# Patient Record
Sex: Female | Born: 1995
Health system: Southern US, Community
[De-identification: ages and names within clinical notes are randomized; demographics above are authoritative.]

## PROBLEM LIST (undated history)

## (undated) HISTORY — PX: NO PAST SURGERIES: SHX2092

---

## 2015-03-08 ENCOUNTER — Encounter: Payer: Self-pay | Admitting: Family Medicine

## 2015-03-08 ENCOUNTER — Ambulatory Visit (INDEPENDENT_AMBULATORY_CARE_PROVIDER_SITE_OTHER): Payer: BLUE CROSS/BLUE SHIELD | Admitting: Family Medicine

## 2015-03-08 VITALS — BP 118/76 | HR 118 | Temp 98.0°F | Resp 16 | Ht 62.5 in | Wt 128.0 lb

## 2015-03-08 DIAGNOSIS — F419 Anxiety disorder, unspecified: Secondary | ICD-10-CM | POA: Insufficient documentation

## 2015-03-08 MED ORDER — ESCITALOPRAM OXALATE 10 MG PO TABS: 10.0000 mg | ORAL_TABLET | Freq: Every day | ORAL | Status: DC

## 2015-03-08 NOTE — Progress Notes (Signed)
Subjective:     Patient ID: Alexis Myers, female   DOB: Sep 14, 1995, 20 y.o.   MRN: [id number]  Chief Complaint  Patient presents with  . Anxiety    Worsening since March 2016    Anxiety Symptoms include nervous/anxious behavior.    Started March 2016, was intermittent now experiences anxiety every day. ( In March 2016, she had hives over her upper body, she thinks secondary to reaction to a shirt she was wearing and anxiety. Hives worsened her anxiety, which also worsened with prednisone). After summer, she felt her friends didn't like her. She "physically could not bring herself" to write papers, stressing out about grades. Grades are ok.  She believes some of it is social anxiety. She feels like she will pass out, though denies syncopal episodes.Does see counselor at school every 1-2 weeks. They work on coping strategies, recognizing negative thoughts, and positive thought exercises. Her counselor is in grad school for training and  not able to start medications. Pt is open to starting medications to help her anxiety here, and has spoken to a friend who takes psych medications. She maxed out her counseling sessions at school, but does not feel she needs to see a counselor on a regular basis at this time.   When anxious, she feels shaky, cold, tongue swelling, heat/anger, intermittent touble swallowing, abd pain. Does feel distracted with negative thoughts, decreased appetite, irritable, tired. Denies palpitations, SOB, chest pain, restlessness. Has some trouble falling asleep sometimes, but mostly good 7-8 hrs a night. Has had 2 panic attacks, last one before a presentation. Lasted 5-10 minutes with crying, heaviness on her chest.   She is a Holiday representative in [employer], Primary school teacher major. She believes she is anxious about "everything". Stressors include job Financial controller, Clinical cytogeneticist at [employer] (from which she had to step down because she felt "incompetent" and wasn't fair to people in the  club), friends, and schoolwork. She has 2 classes this upcoming semester. She has A's and B's but is concerned her lack of motivation will interfere with schoolwork in the new year. She has several close friends including her roommate, who have been supportive. Sometimes she takes it as rejection when friends don't want to hang out with her or text her back quickly, though she recognizes such feelings are probably because of her anxiety. Denies losing friends because of her anxiety. Family is supportive. Here with her mom today.    Rare alcohol use (2 drinks once a month). No tobacco or drug use. Drinks 1 venti coffee and a soda every day at school. She believes caffeine worsens her anxiety.   She is intermittently tearful during the visit. She is accompanied by her mother, and pt insists that her mom stay in the room. Mom takes 10 mg Paxil a day to prevent panic attacks, which started after her pregnancy.       Review of Systems  Constitutional: Negative.   Eyes: Negative.   Respiratory: Negative.   Neurological: Negative.   Psychiatric/Behavioral: Positive for behavioral problems. The patient is nervous/anxious.     Patient Active Problem List   Diagnosis Date Noted  . Anxiety 03/08/2015   Previous Medications   No medications on file   No Known Allergies  Past Surgical History  Procedure Laterality Date  . No past surgeries     Family History  Problem Relation Age of Onset  . Healthy Mother   . Healthy Father   . Healthy Sister   . Diabetes  Other   . Hypertension Other    Social History   Social History  . Marital Status: Single    Spouse Name: N/A  . Number of Children: N/A  . Years of Education: N/A   Occupational History  . Full Time Student at [employer]     Senior   Social History Main Topics  . Smoking status: Never Smoker   . Smokeless tobacco: Never Used  . Alcohol Use: No  . Drug Use: No  . Sexual Activity: Not on file   Other Topics Concern   . Not on file   Social History Narrative       Objective:   Physical Exam  Constitutional: She is oriented to person, place, and time. She appears well-developed and well-nourished. No distress.  HENT:  Head: Normocephalic and atraumatic.  Eyes: EOM are normal. Pupils are equal, round, and reactive to light.  Musculoskeletal: Normal range of motion. She exhibits no edema.  Neurological: She is alert and oriented to person, place, and time. No cranial nerve deficit.  Skin: She is not diaphoretic.  Psychiatric: She has a normal mood and affect. Her behavior is normal. Judgment and thought content normal.  Intermittently tearful  Vitals reviewed.   BP 118/76 mmHg  Pulse 118  Temp(Src) 98 F (36.7 C) (Oral)  Resp 16  Ht 5' 2.5" (1.588 m)  Wt 128 lb (58.06 kg)  BMI 23.02 kg/m2  LMP 02/15/2015 (Exact Date)     Assessment:    Acute Anxiety, worsening.     Plan:     1. Acute anxiety New problem. Not controlled with cognitive therapy. Mom responded to SSRI. Will start Lexapro. Contracted for safety. Will communicate with me thru My Chart. Patient instructed to call back if condition worsens or does not improve.   Mom in room for ov.    Lorie PhenixNancy Aireona Torelli, MD

## 2015-03-16 ENCOUNTER — Encounter: Payer: Self-pay | Admitting: Family Medicine

## 2015-04-04 ENCOUNTER — Encounter: Payer: Self-pay | Admitting: Family Medicine

## 2015-04-06 ENCOUNTER — Other Ambulatory Visit: Payer: Self-pay | Admitting: Family Medicine

## 2015-04-06 MED ORDER — ESCITALOPRAM OXALATE 10 MG PO TABS: 10.0000 mg | ORAL_TABLET | Freq: Every day | ORAL | Status: DC

## 2015-05-20 ENCOUNTER — Encounter: Payer: Self-pay | Admitting: Family Medicine

## 2015-06-30 ENCOUNTER — Other Ambulatory Visit: Payer: Self-pay | Admitting: Family Medicine

## 2015-06-30 DIAGNOSIS — F419 Anxiety disorder, unspecified: Secondary | ICD-10-CM

## 2015-07-04 ENCOUNTER — Encounter: Payer: Self-pay | Admitting: Family Medicine

## 2015-07-04 DIAGNOSIS — F419 Anxiety disorder, unspecified: Secondary | ICD-10-CM

## 2015-07-04 MED ORDER — ESCITALOPRAM OXALATE 10 MG PO TABS: ORAL_TABLET | ORAL | Status: DC

## 2015-08-30 ENCOUNTER — Other Ambulatory Visit: Payer: Self-pay

## 2015-08-30 DIAGNOSIS — F419 Anxiety disorder, unspecified: Secondary | ICD-10-CM

## 2015-08-30 MED ORDER — ESCITALOPRAM OXALATE 10 MG PO TABS: ORAL_TABLET | ORAL | Status: DC

## 2016-02-17 ENCOUNTER — Telehealth: Payer: Self-pay

## 2016-02-17 DIAGNOSIS — F419 Anxiety disorder, unspecified: Secondary | ICD-10-CM

## 2016-02-17 MED ORDER — ESCITALOPRAM OXALATE 10 MG PO TABS: 15.0000 mg | ORAL_TABLET | Freq: Every day | ORAL | 0 refills | Status: DC

## 2016-02-17 NOTE — Telephone Encounter (Signed)
[facility]

## 2016-02-17 NOTE — Telephone Encounter (Signed)
Pt advised-aa 

## 2016-02-17 NOTE — Telephone Encounter (Signed)
Patient is calling requesting a refill on the Lexapro 10mg . She reports that over the last 2 weeks, she has had to take 1.5 tablets (15mg  total), and it seemed to help with her mood. She reports that if she continues to take it this way, she will run out early. Patient reports that she was formerly a patient of Dr. Santiago BurMaloney's. It looks like she seen you once in 07/2014. Ok to send in refill into the pharmacy? Pharmacy has been updated. Thanks!

## 2016-02-17 NOTE — Telephone Encounter (Signed)
Please review-aa 

## 2016-05-09 ENCOUNTER — Telehealth: Payer: Self-pay | Admitting: Physician Assistant

## 2016-05-09 NOTE — Telephone Encounter (Signed)
Mother sent message through [place] about patient gaining weight on lexapro. I would recommend patient being seen to discuss changing since patient has not been seen for this since 03/2015 with Dr. Elease HashimotoMaloney. Please call to see if they are willing to schedule follow up.

## 2016-05-09 NOTE — Telephone Encounter (Signed)
Patient advised. Follow up appointment scheduled.  

## 2016-05-14 ENCOUNTER — Ambulatory Visit: Payer: Self-pay | Admitting: Physician Assistant

## 2016-05-25 ENCOUNTER — Encounter: Payer: Self-pay | Admitting: Physician Assistant

## 2016-05-25 ENCOUNTER — Ambulatory Visit (INDEPENDENT_AMBULATORY_CARE_PROVIDER_SITE_OTHER): Payer: BLUE CROSS/BLUE SHIELD | Admitting: Physician Assistant

## 2016-05-25 VITALS — BP 130/70 | HR 65 | Temp 98.7°F | Resp 16 | Wt 155.0 lb

## 2016-05-25 DIAGNOSIS — F411 Generalized anxiety disorder: Secondary | ICD-10-CM

## 2016-05-25 DIAGNOSIS — R635 Abnormal weight gain: Secondary | ICD-10-CM

## 2016-05-25 DIAGNOSIS — T50905A Adverse effect of unspecified drugs, medicaments and biological substances, initial encounter: Secondary | ICD-10-CM

## 2016-05-25 MED ORDER — PAROXETINE HCL 10 MG PO TABS: 10.0000 mg | ORAL_TABLET | Freq: Every day | ORAL | 0 refills | Status: DC

## 2016-05-25 NOTE — Progress Notes (Signed)
       Patient: Alexis Myers Female    DOB: 1995/05/11   21 y.o.   MRN: [id number] Visit Date: 05/25/2016  Today's Provider: Margaretann LovelessJennifer M Earley Grobe, PA-C   Chief Complaint  Patient presents with  . Weight Check   Subjective:    HPI Patient is here today to discuss weight gain.  Pt Mother sent message through mychart about patient gaining weight on lexapro. Patient was started on Lexapro 10 mg on 03/08/15 by previous doctor for Anxiety. Last refill on medication was on 02/17/16 and was increased to 15mg  because patient reported that she was taking 1.5 tablet (15mg  total) because it seemed to help her mood. Side effect from the Lexapro wt gain.  She feels that her mood is stable on the Lexapro, but she doesn't like the weight gain. She reports that she is cooking more meals at home instead of eating out. She used to walk more than know but she is getting back on track.     No Known Allergies   Current Outpatient Prescriptions:  .  escitalopram (LEXAPRO) 10 MG tablet, Take 1.5 tablets (15 mg total) by mouth at bedtime. TAKE 1 TABLET (10 MG TOTAL) BY MOUTH DAILY., Disp: 135 tablet, Rfl: 0  Review of Systems  Constitutional: Positive for unexpected weight change.  Respiratory: Negative.   Cardiovascular: Negative.   Neurological: Negative.   Psychiatric/Behavioral: Negative.     Social History  Substance Use Topics  . Smoking status: Never Smoker  . Smokeless tobacco: Never Used  . Alcohol use No   Objective:   BP 130/70   Pulse 65   Temp 98.7 F (37.1 C) (Oral)   Resp 16   Wt 155 lb (70.3 kg)   LMP 05/10/2016   BMI 27.90 kg/m    Physical Exam  Constitutional: She appears well-developed and well-nourished. No distress.  Neck: Normal range of motion. Neck supple.  Cardiovascular: Normal rate, regular rhythm and normal heart sounds.  Exam reveals no gallop and no friction rub.   No murmur heard. Pulmonary/Chest: Effort normal and breath sounds normal. No respiratory  distress. She has no wheezes. She has no rales.  Skin: She is not diaphoretic.  Psychiatric: She has a normal mood and affect. Her behavior is normal. Judgment and thought content normal.  Vitals reviewed.       Assessment & Plan:     1. GAD (generalized anxiety disorder) Had previously been stable but has had steady weight gain since starting lexapro last year. Her mother is taking paxil and doing well, thus she is interested in trying this. Paxil was prescribed as below. I will see her back in 4 weeks to recheck and see how she is tolerating the change.  - PARoxetine (PAXIL) 10 MG tablet; Take 1 tablet (10 mg total) by mouth daily. May take 2 tabs daily after 1st week if needed  Dispense: 60 tablet; Refill: 0  2. Weight gain due to medication Secondary to Lexapro.       Margaretann LovelessJennifer M Yacob Wilkerson, PA-C  [facility]

## 2016-05-25 NOTE — Patient Instructions (Signed)
Discontinuing lexapro: Take 10mg  (1 tab) PO daily x 3-4 days then decrease to 1/2 tab (5mg ) x 1 week, then discontinue. Once discontinued you may start paroxetine.

## 2016-07-16 ENCOUNTER — Encounter: Payer: Self-pay | Admitting: Physician Assistant

## 2016-08-10 ENCOUNTER — Encounter: Payer: Self-pay | Admitting: Physician Assistant

## 2016-08-10 DIAGNOSIS — F411 Generalized anxiety disorder: Secondary | ICD-10-CM

## 2016-08-10 MED ORDER — PAROXETINE HCL 10 MG PO TABS: 10.0000 mg | ORAL_TABLET | Freq: Every day | ORAL | 5 refills | Status: DC

## 2017-01-28 ENCOUNTER — Other Ambulatory Visit: Payer: Self-pay | Admitting: Physician Assistant

## 2017-01-28 DIAGNOSIS — F411 Generalized anxiety disorder: Secondary | ICD-10-CM

## 2017-01-28 NOTE — Telephone Encounter (Signed)
Last ov 05/25/16 Last filled 08/10/16 Please review. Thank you. sd

## 2017-02-13 DIAGNOSIS — J069 Acute upper respiratory infection, unspecified: Secondary | ICD-10-CM | POA: Diagnosis not present

## 2017-02-22 ENCOUNTER — Other Ambulatory Visit: Payer: Self-pay | Admitting: Physician Assistant

## 2017-02-22 DIAGNOSIS — F411 Generalized anxiety disorder: Secondary | ICD-10-CM

## 2017-02-22 MED ORDER — PAROXETINE HCL 10 MG PO TABS: 10.0000 mg | ORAL_TABLET | Freq: Every day | ORAL | 1 refills | Status: DC

## 2017-02-22 NOTE — Telephone Encounter (Signed)
[facility] request for the following medications:  PARoxetine (PAXIL) 10 MG tablet  90 day supply  Pt has active Rx at pharmacy but they are requesting 90 day supply instead of the 30 they received 01/2017. LOV: 05/25/16 Please advise. Thanks TNP

## 2017-02-22 NOTE — Telephone Encounter (Signed)
Done

## 2017-06-06 DIAGNOSIS — L720 Epidermal cyst: Secondary | ICD-10-CM | POA: Diagnosis not present

## 2017-06-06 DIAGNOSIS — L219 Seborrheic dermatitis, unspecified: Secondary | ICD-10-CM | POA: Diagnosis not present

## 2017-08-07 DIAGNOSIS — L219 Seborrheic dermatitis, unspecified: Secondary | ICD-10-CM | POA: Diagnosis not present

## 2017-08-07 DIAGNOSIS — D22121 Melanocytic nevi of left upper eyelid, including canthus: Secondary | ICD-10-CM | POA: Diagnosis not present

## 2017-08-07 DIAGNOSIS — D485 Neoplasm of uncertain behavior of skin: Secondary | ICD-10-CM | POA: Diagnosis not present

## 2017-09-03 ENCOUNTER — Other Ambulatory Visit: Payer: Self-pay | Admitting: Physician Assistant

## 2017-09-03 DIAGNOSIS — F411 Generalized anxiety disorder: Secondary | ICD-10-CM

## 2017-10-23 ENCOUNTER — Other Ambulatory Visit: Payer: Self-pay | Admitting: Physician Assistant

## 2017-10-23 DIAGNOSIS — F411 Generalized anxiety disorder: Secondary | ICD-10-CM

## 2017-10-23 NOTE — Telephone Encounter (Signed)
Patient needs appt before more refills; possibly cpe

## 2017-11-06 ENCOUNTER — Ambulatory Visit: Payer: BLUE CROSS/BLUE SHIELD | Admitting: Physician Assistant

## 2017-11-13 ENCOUNTER — Ambulatory Visit: Payer: BLUE CROSS/BLUE SHIELD | Admitting: Physician Assistant

## 2017-11-18 ENCOUNTER — Ambulatory Visit (INDEPENDENT_AMBULATORY_CARE_PROVIDER_SITE_OTHER): Payer: BLUE CROSS/BLUE SHIELD | Admitting: Physician Assistant

## 2017-11-18 ENCOUNTER — Encounter: Payer: Self-pay | Admitting: Physician Assistant

## 2017-11-18 VITALS — BP 130/80 | HR 89 | Temp 97.6°F | Resp 16 | Ht 63.0 in | Wt 164.6 lb

## 2017-11-18 DIAGNOSIS — Z2821 Immunization not carried out because of patient refusal: Secondary | ICD-10-CM

## 2017-11-18 DIAGNOSIS — Z6829 Body mass index (BMI) 29.0-29.9, adult: Secondary | ICD-10-CM

## 2017-11-18 DIAGNOSIS — F419 Anxiety disorder, unspecified: Secondary | ICD-10-CM | POA: Diagnosis not present

## 2017-11-18 MED ORDER — PAROXETINE HCL 10 MG PO TABS: 10.0000 mg | ORAL_TABLET | Freq: Every day | ORAL | 1 refills | Status: DC

## 2017-11-18 NOTE — Progress Notes (Signed)
       Patient: Alexis Myers Female    DOB: 02-06-1996   22 y.o.   MRN: [id number] Visit Date: 11/18/2017  Today's Provider: Margaretann LovelessJennifer M Trigg Delarocha, PA-C   Chief Complaint  Patient presents with  . Anxiety   Subjective:    HPI  GAD, Follow up:  The patient was last seen for GAD 1 years ago. Changes made since that visit include discontinuing Lexapro and start Paxil.  She reports excellent compliance with treatment. She is not having side effects. ------------------------------------------------------------------------    No Known Allergies   Current Outpatient Medications:  .  PARoxetine (PAXIL) 10 MG tablet, TAKE 1 TABLET (10 MG TOTAL) BY MOUTH DAILY. PLEASE SCHEDULE APPOINTMENT FOR FUTURE REFILLS, Disp: 30 tablet, Rfl: 0  Review of Systems  Constitutional: Negative.   Respiratory: Negative.   Cardiovascular: Negative.   Neurological: Negative.   Psychiatric/Behavioral: Negative.     Social History   Tobacco Use  . Smoking status: Never Smoker  . Smokeless tobacco: Never Used  Substance Use Topics  . Alcohol use: No   Objective:   BP 130/80 (BP Location: Right Arm, Patient Position: Sitting, Cuff Size: Normal)   Pulse 89   Temp 97.6 F (36.4 C) (Oral)   Resp 16   Ht 5\' 3"  (1.6 m)   Wt 164 lb 9.6 oz (74.7 kg)   BMI 29.16 kg/m  Vitals:   11/18/17 0956  BP: 130/80  Pulse: 89  Resp: 16  Temp: 97.6 F (36.4 C)  TempSrc: Oral  Weight: 164 lb 9.6 oz (74.7 kg)  Height: 5\' 3"  (1.6 m)     Physical Exam  Constitutional: She appears well-developed and well-nourished. No distress.  Neck: Normal range of motion. Neck supple.  Cardiovascular: Normal rate, regular rhythm and normal heart sounds. Exam reveals no gallop and no friction rub.  No murmur heard. Pulmonary/Chest: Effort normal and breath sounds normal. No respiratory distress. She has no wheezes. She has no rales.  Skin: She is not diaphoretic.  Vitals reviewed.   Depression screen PHQ 2/9  11/18/2017  Decreased Interest 0  Down, Depressed, Hopeless 0  PHQ - 2 Score 0      Assessment & Plan:     1. Anxiety Stable. Diagnosis pulled for medication refill. Continue current medical treatment plan. - PARoxetine (PAXIL) 10 MG tablet; Take 1 tablet (10 mg total) by mouth daily.  Dispense: 90 tablet; Refill: 1  2. Influenza vaccination declined  3. BMI 29.0-29.9,adult Counseled patient on healthy lifestyle modifications including dieting and exercise.        Margaretann LovelessJennifer M Joanthan Hlavacek, PA-C  [facility]

## 2017-11-29 ENCOUNTER — Ambulatory Visit: Payer: BLUE CROSS/BLUE SHIELD | Admitting: Physician Assistant

## 2017-12-10 DIAGNOSIS — Z23 Encounter for immunization: Secondary | ICD-10-CM | POA: Diagnosis not present

## 2018-05-15 ENCOUNTER — Other Ambulatory Visit: Payer: Self-pay | Admitting: Physician Assistant

## 2018-05-15 DIAGNOSIS — F419 Anxiety disorder, unspecified: Secondary | ICD-10-CM

## 2018-05-15 NOTE — Telephone Encounter (Signed)
[facility] refill request for the following medications:  PARoxetine (PAXIL) 10 MG tablet   Last Rx: 11/18/2017 with 2 refills LOV: 11/18/17 Please advise. Thanks TNP

## 2018-05-16 MED ORDER — PAROXETINE HCL 10 MG PO TABS: 10.0000 mg | ORAL_TABLET | Freq: Every day | ORAL | 1 refills | Status: DC

## 2018-08-20 DIAGNOSIS — H15101 Unspecified episcleritis, right eye: Secondary | ICD-10-CM | POA: Diagnosis not present

## 2018-11-06 ENCOUNTER — Telehealth: Payer: Self-pay

## 2018-11-06 NOTE — Telephone Encounter (Signed)
Opened in error

## 2018-11-18 ENCOUNTER — Telehealth: Payer: Self-pay | Admitting: Physician Assistant

## 2018-11-18 DIAGNOSIS — F419 Anxiety disorder, unspecified: Secondary | ICD-10-CM

## 2018-11-27 NOTE — Progress Notes (Signed)
Patient: Alexis Myers, Female    DOB: 10/07/1995, 23 y.o.   MRN: [id number] Visit Date: 11/28/2018  Today's Provider: Mar Daring, PA-C   Chief Complaint  Patient presents with  . Annual Exam   Subjective:     Annual physical exam Alexis Myers is a 23 y.o. female who presents today for health maintenance and complete physical. She feels well. She reports exercising yes/walking. She reports she is sleeping well. -----------------------------------------------------------   Review of Systems  Constitutional: Negative.   HENT: Negative.   Eyes: Negative.   Respiratory: Negative.   Cardiovascular: Negative.   Gastrointestinal: Negative.   Endocrine: Negative.   Genitourinary: Negative.   Musculoskeletal: Negative.   Skin: Negative.   Allergic/Immunologic: Negative.   Neurological: Negative.   Hematological: Negative.   Psychiatric/Behavioral: The patient is nervous/anxious.     Social History      She  reports that she has never smoked. She has never used smokeless tobacco. She reports that she does not drink alcohol or use drugs.       Social History   Socioeconomic History  . Marital status: Single    Spouse name: Not on file  . Number of children: Not on file  . Years of education: Not on file  . Highest education level: Not on file  Occupational History  . Occupation: Full Time Ship broker at [employer]  Social Needs  . Financial resource strain: Not on file  . Food insecurity    Worry: Not on file    Inability: Not on file  . Transportation needs    Medical: Not on file    Non-medical: Not on file  Tobacco Use  . Smoking status: Never Smoker  . Smokeless tobacco: Never Used  Substance and Sexual Activity  . Alcohol use: No  . Drug use: No  . Sexual activity: Not on file  Lifestyle  . Physical activity    Days per week: Not on file    Minutes per session: Not on file  . Stress: Not on file  Relationships  .  Social Herbalist on phone: Not on file    Gets together: Not on file    Attends religious service: Not on file    Active member of club or organization: Not on file    Attends meetings of clubs or organizations: Not on file    Relationship status: Not on file  Other Topics Concern  . Not on file  Social History Narrative  . Not on file    History reviewed. No pertinent past medical history.   Patient Active Problem List   Diagnosis Date Noted  . Anxiety 03/08/2015    Past Surgical History:  Procedure Laterality Date  . NO PAST SURGERIES      Family History        Family Status  Relation Name Status  . Mother  Alive  . Father  Alive  . Sister  Alive  . Other  (Not Specified)        Her family history includes Diabetes in an other family member; Healthy in her father, mother, and sister; Hypertension in an other family member.      No Known Allergies   Current Outpatient Medications:  .  PARoxetine (PAXIL) 10 MG tablet, TAKE 1 TABLET BY MOUTH EVERY DAY, Disp: 90 tablet, Rfl: 1   Patient Care Team: Rubye Beach as PCP -  General (Family Medicine)    Objective:    Vitals: BP 135/84 (BP Location: Right Arm, Patient Position: Sitting, Cuff Size: Large)   Pulse (!) 105   Temp (!) 97.3 F (36.3 C) (Other (Comment))   Resp 16   Ht '5\' 3"'  (1.6 m)   Wt 165 lb (74.8 kg)   LMP 11/13/2018   SpO2 97%   BMI 29.23 kg/m    Vitals:   11/28/18 1406  BP: 135/84  Pulse: (!) 105  Resp: 16  Temp: (!) 97.3 F (36.3 C)  TempSrc: Other (Comment)  SpO2: 97%  Weight: 165 lb (74.8 kg)  Height: '5\' 3"'  (1.6 m)     Physical Exam Vitals signs reviewed.  Constitutional:      General: She is not in acute distress.    Appearance: Normal appearance. She is well-developed. She is not ill-appearing or diaphoretic.  HENT:     Head: Normocephalic and atraumatic.     Right Ear: Tympanic membrane, ear canal and external ear normal.     Left Ear: Tympanic  membrane, ear canal and external ear normal.     Nose: Nose normal.     Mouth/Throat:     Mouth: Mucous membranes are moist.     Pharynx: No oropharyngeal exudate.  Eyes:     General: No scleral icterus.       Right eye: No discharge.        Left eye: No discharge.     Extraocular Movements: Extraocular movements intact.     Conjunctiva/sclera: Conjunctivae normal.     Pupils: Pupils are equal, round, and reactive to light.  Neck:     Musculoskeletal: Normal range of motion and neck supple.     Thyroid: No thyromegaly.     Vascular: No JVD.     Trachea: No tracheal deviation.  Cardiovascular:     Rate and Rhythm: Normal rate and regular rhythm.     Pulses: Normal pulses.     Heart sounds: Normal heart sounds. No murmur. No friction rub. No gallop.   Pulmonary:     Effort: Pulmonary effort is normal. No respiratory distress.     Breath sounds: Normal breath sounds. No wheezing or rales.  Chest:     Chest wall: No tenderness.  Abdominal:     General: Abdomen is flat. Bowel sounds are normal. There is no distension.     Palpations: Abdomen is soft. There is no mass.     Tenderness: There is no abdominal tenderness. There is no guarding or rebound.  Genitourinary:    Comments: Deferred; patient going to GYN in [place] Musculoskeletal: Normal range of motion.        General: No tenderness.     Right lower leg: No edema.     Left lower leg: No edema.  Lymphadenopathy:     Cervical: No cervical adenopathy.  Skin:    General: Skin is warm and dry.     Capillary Refill: Capillary refill takes less than 2 seconds.     Findings: No rash.  Neurological:     General: No focal deficit present.     Mental Status: She is alert and oriented to person, place, and time. Mental status is at baseline.  Psychiatric:        Mood and Affect: Mood normal.        Behavior: Behavior normal.        Thought Content: Thought content normal.        Judgment:  Judgment normal.       Depression Screen PHQ 2/9 Scores 11/28/2018 11/18/2017  PHQ - 2 Score 0 0  PHQ- 9 Score 0 -       Assessment & Plan:     Routine Health Maintenance and Physical Exam  Exercise Activities and Dietary recommendations Goals   None     Immunization History  Administered Date(s) Administered  . DTaP 11/22/1993, 01/24/1994, 03/28/1994, 01/08/1995, 10/03/1998  . HPV Quadrivalent 01/15/2005, 03/19/2005, 07/17/2005  . Hepatitis A 02/03/2007, 07/27/2011  . Hepatitis B 09-13-1993, 11/22/1993, 06/28/1994  . HiB (PRP-OMP) 11/22/1993, 01/24/1994, 03/28/1994, 01/08/1995  . IPV 11/22/1993, 01/24/1994, 03/28/1994, 10/03/1998  . Influenza-Unspecified 12/17/2013, 12/25/2014, 12/06/2016, 12/10/2017  . MMR 09/27/1994, 10/03/1998  . Meningococcal Conjugate 07/27/2011  . Td 02/03/2007  . Tdap 02/03/2007, 12/10/2017  . Varicella 01/08/1995, 02/03/2007    Health Maintenance  Topic Date Due  . HIV Screening  09/25/2008  . PAP-Cervical Cytology Screening  09/26/2014  . PAP SMEAR-Modifier  09/26/2014  . INFLUENZA VACCINE  10/04/2018  . TETANUS/TDAP  12/11/2027     Discussed health benefits of physical activity, and encouraged her to engage in regular exercise appropriate for her age and condition.    1. Annual physical exam Normal physical exam today. Will check labs as below and f/u pending lab results. If labs are stable and WNL she will not need to have these rechecked for one year at her next annual physical exam. She is to call the office in the meantime if she has any acute issue, questions or concerns. - CBC w/Diff/Platelet - Comprehensive Metabolic Panel (CMET) - TSH - Lipid Profile - HgB A1c  2. Cervical cancer screening Deferred. Patient wants to establish with GYN in [facility].   3. Anxiety Stable on Paroxetine 39m. Will check labs as below and f/u pending results.  4. BMI 29.0-29.9,adult Counseled patient on healthy lifestyle modifications including dieting and  exercise.  - Lipid Profile - HgB A1c  5. Need for influenza vaccination Flu vaccine given today without complication. Patient sat upright for 15 minutes to check for adverse reaction before being released. - Flu Vaccine QUAD 36+ mos IM --------------------------------------------------------------------    JMar Daring PA-C  [facility]

## 2018-11-28 ENCOUNTER — Encounter: Payer: Self-pay | Admitting: Physician Assistant

## 2018-11-28 ENCOUNTER — Other Ambulatory Visit: Payer: Self-pay

## 2018-11-28 ENCOUNTER — Ambulatory Visit (INDEPENDENT_AMBULATORY_CARE_PROVIDER_SITE_OTHER): Payer: BC Managed Care – PPO | Admitting: Physician Assistant

## 2018-11-28 VITALS — BP 135/84 | HR 105 | Temp 97.3°F | Resp 16 | Ht 63.0 in | Wt 165.0 lb

## 2018-11-28 DIAGNOSIS — Z0000 Encounter for general adult medical examination without abnormal findings: Secondary | ICD-10-CM

## 2018-11-28 DIAGNOSIS — Z124 Encounter for screening for malignant neoplasm of cervix: Secondary | ICD-10-CM | POA: Diagnosis not present

## 2018-11-28 DIAGNOSIS — Z23 Encounter for immunization: Secondary | ICD-10-CM | POA: Diagnosis not present

## 2018-11-28 DIAGNOSIS — F419 Anxiety disorder, unspecified: Secondary | ICD-10-CM | POA: Diagnosis not present

## 2018-11-28 DIAGNOSIS — Z6829 Body mass index (BMI) 29.0-29.9, adult: Secondary | ICD-10-CM

## 2018-11-28 NOTE — Patient Instructions (Signed)
Health Maintenance, Female Adopting a healthy lifestyle and getting preventive care are important in promoting health and wellness. Ask your health care provider about:  The right schedule for you to have regular tests and exams.  Things you can do on your own to prevent diseases and keep yourself healthy. What should I know about diet, weight, and exercise? Eat a healthy diet   Eat a diet that includes plenty of vegetables, fruits, low-fat dairy products, and lean protein.  Do not eat a lot of foods that are high in solid fats, added sugars, or sodium. Maintain a healthy weight Body mass index (BMI) is used to identify weight problems. It estimates body fat based on height and weight. Your health care provider can help determine your BMI and help you achieve or maintain a healthy weight. Get regular exercise Get regular exercise. This is one of the most important things you can do for your health. Most adults should:  Exercise for at least 150 minutes each week. The exercise should increase your heart rate and make you sweat (moderate-intensity exercise).  Do strengthening exercises at least twice a week. This is in addition to the moderate-intensity exercise.  Spend less time sitting. Even light physical activity can be beneficial. Watch cholesterol and blood lipids Have your blood tested for lipids and cholesterol at 25 years of age, then have this test every 5 years. Have your cholesterol levels checked more often if:  Your lipid or cholesterol levels are high.  You are older than 25 years of age.  You are at high risk for heart disease. What should I know about cancer screening? Depending on your health history and family history, you may need to have cancer screening at various ages. This may include screening for:  Breast cancer.  Cervical cancer.  Colorectal cancer.  Skin cancer.  Lung cancer. What should I know about heart disease, diabetes, and high blood  pressure? Blood pressure and heart disease  High blood pressure causes heart disease and increases the risk of stroke. This is more likely to develop in people who have high blood pressure readings, are of African descent, or are overweight.  Have your blood pressure checked: ? Every 3-5 years if you are 18-39 years of age. ? Every year if you are 40 years old or older. Diabetes Have regular diabetes screenings. This checks your fasting blood sugar level. Have the screening done:  Once every three years after age 40 if you are at a normal weight and have a low risk for diabetes.  More often and at a younger age if you are overweight or have a high risk for diabetes. What should I know about preventing infection? Hepatitis B If you have a higher risk for hepatitis B, you should be screened for this virus. Talk with your health care provider to find out if you are at risk for hepatitis B infection. Hepatitis C Testing is recommended for:  Everyone born from 1945 through 1965.  Anyone with known risk factors for hepatitis C. Sexually transmitted infections (STIs)  Get screened for STIs, including gonorrhea and chlamydia, if: ? You are sexually active and are younger than 24 years of age. ? You are older than 24 years of age and your health care provider tells you that you are at risk for this type of infection. ? Your sexual activity has changed since you were last screened, and you are at increased risk for chlamydia or gonorrhea. Ask your health care provider if   you are at risk.  Ask your health care provider about whether you are at high risk for HIV. Your health care provider may recommend a prescription medicine to help prevent HIV infection. If you choose to take medicine to prevent HIV, you should first get tested for HIV. You should then be tested every 3 months for as long as you are taking the medicine. Pregnancy  If you are about to stop having your period (premenopausal) and  you may become pregnant, seek counseling before you get pregnant.  Take 400 to 800 micrograms (mcg) of folic acid every day if you become pregnant.  Ask for birth control (contraception) if you want to prevent pregnancy. Osteoporosis and menopause Osteoporosis is a disease in which the bones lose minerals and strength with aging. This can result in bone fractures. If you are 65 years old or older, or if you are at risk for osteoporosis and fractures, ask your health care provider if you should:  Be screened for bone loss.  Take a calcium or vitamin D supplement to lower your risk of fractures.  Be given hormone replacement therapy (HRT) to treat symptoms of menopause. Follow these instructions at home: Lifestyle  Do not use any products that contain nicotine or tobacco, such as cigarettes, e-cigarettes, and chewing tobacco. If you need help quitting, ask your health care provider.  Do not use street drugs.  Do not share needles.  Ask your health care provider for help if you need support or information about quitting drugs. Alcohol use  Do not drink alcohol if: ? Your health care provider tells you not to drink. ? You are pregnant, may be pregnant, or are planning to become pregnant.  If you drink alcohol: ? Limit how much you use to 0-1 drink a day. ? Limit intake if you are breastfeeding.  Be aware of how much alcohol is in your drink. In the [place] one drink equals one 12 oz bottle of beer (355 mL), one 5 oz glass of wine (148 mL), or one 1 oz glass of hard liquor (44 mL). General instructions  Schedule regular health, dental, and eye exams.  Stay current with your vaccines.  Tell your health care provider if: ? You often feel depressed. ? You have ever been abused or do not feel safe at home. Summary  Adopting a healthy lifestyle and getting preventive care are important in promoting health and wellness.  Follow your health care provider's instructions about healthy  diet, exercising, and getting tested or screened for diseases.  Follow your health care provider's instructions on monitoring your cholesterol and blood pressure. This information is not intended to replace advice given to you by your health care provider. Make sure you discuss any questions you have with your health care provider. Document Released: 09/04/2010 Document Revised: 02/12/2018 Document Reviewed: 02/12/2018 Elsevier Patient Education  2020 Elsevier Inc.  

## 2018-11-29 LAB — COMPREHENSIVE METABOLIC PANEL
ALT: 126 IU/L — ABNORMAL HIGH (ref 0–32)
AST: 74 IU/L — ABNORMAL HIGH (ref 0–40)
Albumin/Globulin Ratio: 1.7 (ref 1.2–2.2)
Albumin: 4.8 g/dL (ref 3.9–5.0)
Alkaline Phosphatase: 111 IU/L (ref 39–117)
BUN/Creatinine Ratio: 14 (ref 9–23)
BUN: 11 mg/dL (ref 6–20)
Bilirubin Total: 0.3 mg/dL (ref 0.0–1.2)
CO2: 19 mmol/L — ABNORMAL LOW (ref 20–29)
Calcium: 10.1 mg/dL (ref 8.7–10.2)
Chloride: 103 mmol/L (ref 96–106)
Creatinine, Ser: 0.76 mg/dL (ref 0.57–1.00)
GFR calc Af Amer: 126 mL/min/{1.73_m2} (ref >59)
GFR calc non Af Amer: 109 mL/min/{1.73_m2} (ref >59)
Globulin, Total: 2.9 g/dL (ref 1.5–4.5)
Glucose: 83 mg/dL (ref 65–99)
Potassium: 4.2 mmol/L (ref 3.5–5.2)
Sodium: 136 mmol/L (ref 134–144)
Total Protein: 7.7 g/dL (ref 6.0–8.5)

## 2018-11-29 LAB — LIPID PANEL
Chol/HDL Ratio: 4.8 ratio — ABNORMAL HIGH (ref 0.0–4.4)
Cholesterol, Total: 283 mg/dL — ABNORMAL HIGH (ref 100–199)
HDL: 59 mg/dL (ref >39)
LDL Chol Calc (NIH): 198 mg/dL — ABNORMAL HIGH (ref 0–99)
Triglycerides: 142 mg/dL (ref 0–149)
VLDL Cholesterol Cal: 26 mg/dL (ref 5–40)

## 2018-11-29 LAB — CBC WITH DIFFERENTIAL/PLATELET
Basophils Absolute: 0 10*3/uL (ref 0.0–0.2)
Basos: 1 %
EOS (ABSOLUTE): 0.3 10*3/uL (ref 0.0–0.4)
Eos: 5 %
Hematocrit: 41.5 % (ref 34.0–46.6)
Hemoglobin: 14.4 g/dL (ref 11.1–15.9)
Immature Grans (Abs): 0 10*3/uL (ref 0.0–0.1)
Immature Granulocytes: 0 %
Lymphocytes Absolute: 2 10*3/uL (ref 0.7–3.1)
Lymphs: 33 %
MCH: 31.2 pg (ref 26.6–33.0)
MCHC: 34.7 g/dL (ref 31.5–35.7)
MCV: 90 fL (ref 79–97)
Monocytes Absolute: 0.8 10*3/uL (ref 0.1–0.9)
Monocytes: 13 %
Neutrophils Absolute: 2.9 10*3/uL (ref 1.4–7.0)
Neutrophils: 48 %
Platelets: 215 10*3/uL (ref 150–450)
RBC: 4.62 x10E6/uL (ref 3.77–5.28)
RDW: 12.3 % (ref 11.7–15.4)
WBC: 6 10*3/uL (ref 3.4–10.8)

## 2018-11-29 LAB — HEMOGLOBIN A1C
Est. average glucose Bld gHb Est-mCnc: 97 mg/dL
Hgb A1c MFr Bld: 5 % (ref 4.8–5.6)

## 2018-11-29 LAB — TSH: TSH: 3.48 u[IU]/mL (ref 0.450–4.500)

## 2018-12-03 ENCOUNTER — Telehealth: Payer: Self-pay

## 2018-12-03 NOTE — Telephone Encounter (Signed)
Viewed by Metro Kung on 12/03/2018 3:35 PM

## 2018-12-03 NOTE — Telephone Encounter (Signed)
-----   Message from Mar Daring, [place] sent at 12/03/2018  3:27 PM EDT ----- Blood count is normal. Kidney function is normal. Liver enzymes are elevated. Normally this is due to lifestyle issues. Normally we recommend to limit fatty foods, alcohol and tylenol (acetaminophen) products. We should recheck in 6 weeks. No appointment needed just call for lab slip. Thyroid is normal. Cholesterol is also elevated. Again limit fatty foods, sugars and red meats. A1c/sugar is normal.

## 2019-01-22 DIAGNOSIS — Z6828 Body mass index (BMI) 28.0-28.9, adult: Secondary | ICD-10-CM | POA: Diagnosis not present

## 2019-01-22 DIAGNOSIS — Z01419 Encounter for gynecological examination (general) (routine) without abnormal findings: Secondary | ICD-10-CM | POA: Diagnosis not present

## 2019-01-22 DIAGNOSIS — Z124 Encounter for screening for malignant neoplasm of cervix: Secondary | ICD-10-CM | POA: Diagnosis not present

## 2019-02-13 ENCOUNTER — Telehealth: Payer: Self-pay | Admitting: Physician Assistant

## 2019-02-13 DIAGNOSIS — R748 Abnormal levels of other serum enzymes: Secondary | ICD-10-CM

## 2019-02-13 NOTE — Telephone Encounter (Signed)
Copied from [place] [id number]. Topic: General - Inquiry >> Feb 13, 2019 12:06 PM Mathis Bud wrote: Reason for CRM: Patient is requesting to get labs done.  Patient states she was suppose to have labs rechecked in 6 weeks.  Patient is behind.  Patient has no lab orders   Call back [phone]

## 2019-02-13 NOTE — Telephone Encounter (Signed)
Patient advised as directed below. 

## 2019-02-16 ENCOUNTER — Telehealth: Payer: Self-pay

## 2019-02-16 DIAGNOSIS — R748 Abnormal levels of other serum enzymes: Secondary | ICD-10-CM | POA: Diagnosis not present

## 2019-02-16 NOTE — Telephone Encounter (Signed)
Per provider we don't check those because they are only educational and don't mean anything. She can donate blood and they will tell her if she has them. Patient advised and stated she was just curious.

## 2019-02-16 NOTE — Telephone Encounter (Signed)
Pt had labs drawn this morning and wanted to see if she could have the Covid 19 antibody test added. Please advise. Thanks TNP

## 2019-02-17 ENCOUNTER — Encounter: Payer: Self-pay | Admitting: Physician Assistant

## 2019-02-17 LAB — COMPREHENSIVE METABOLIC PANEL
ALT: 202 IU/L — ABNORMAL HIGH (ref 0–32)
AST: 104 IU/L — ABNORMAL HIGH (ref 0–40)
Albumin/Globulin Ratio: 1.9 (ref 1.2–2.2)
Albumin: 4.6 g/dL (ref 3.9–5.0)
Alkaline Phosphatase: 123 IU/L — ABNORMAL HIGH (ref 39–117)
BUN/Creatinine Ratio: 16 (ref 9–23)
BUN: 12 mg/dL (ref 6–20)
Bilirubin Total: 0.5 mg/dL (ref 0.0–1.2)
CO2: 20 mmol/L (ref 20–29)
Calcium: 9.7 mg/dL (ref 8.7–10.2)
Chloride: 99 mmol/L (ref 96–106)
Creatinine, Ser: 0.76 mg/dL (ref 0.57–1.00)
GFR calc Af Amer: 126 mL/min/{1.73_m2} (ref >59)
GFR calc non Af Amer: 109 mL/min/{1.73_m2} (ref >59)
Globulin, Total: 2.4 g/dL (ref 1.5–4.5)
Glucose: 85 mg/dL (ref 65–99)
Potassium: 3.8 mmol/L (ref 3.5–5.2)
Sodium: 135 mmol/L (ref 134–144)
Total Protein: 7 g/dL (ref 6.0–8.5)

## 2019-02-17 NOTE — Addendum Note (Signed)
Addended by: Mar Daring on: 02/17/2019 01:57 PM   Modules accepted: Orders

## 2019-02-23 ENCOUNTER — Ambulatory Visit
Admission: RE | Admit: 2019-02-23 | Discharge: 2019-02-23 | Disposition: A | Discharge status: Home / Self Care | Payer: BC Managed Care – PPO | Source: Ambulatory Visit | Attending: Physician Assistant | Admitting: Physician Assistant

## 2019-02-23 ENCOUNTER — Other Ambulatory Visit: Payer: Self-pay

## 2019-02-23 ENCOUNTER — Encounter: Payer: Self-pay | Admitting: Physician Assistant

## 2019-02-23 DIAGNOSIS — R748 Abnormal levels of other serum enzymes: Secondary | ICD-10-CM

## 2019-02-23 DIAGNOSIS — K7689 Other specified diseases of liver: Secondary | ICD-10-CM | POA: Diagnosis not present

## 2019-03-03 ENCOUNTER — Ambulatory Visit: Payer: BC Managed Care – PPO | Attending: Internal Medicine

## 2019-03-03 DIAGNOSIS — Z20828 Contact with and (suspected) exposure to other viral communicable diseases: Secondary | ICD-10-CM | POA: Diagnosis not present

## 2019-03-03 DIAGNOSIS — Z20822 Contact with and (suspected) exposure to covid-19: Secondary | ICD-10-CM

## 2019-03-04 LAB — NOVEL CORONAVIRUS, NAA: SARS-CoV-2, NAA: NOT DETECTED

## 2019-05-20 ENCOUNTER — Other Ambulatory Visit: Payer: Self-pay | Admitting: Physician Assistant

## 2019-05-20 DIAGNOSIS — F419 Anxiety disorder, unspecified: Secondary | ICD-10-CM

## 2019-05-20 NOTE — Telephone Encounter (Signed)
Requested Prescriptions  Pending Prescriptions Disp Refills  . PARoxetine (PAXIL) 10 MG tablet [Pharmacy Med Name: PAROXETINE HCL 10 MG TABLET] 30 tablet 0    Sig: TAKE 1 TABLET BY MOUTH EVERY DAY     Psychiatry:  Antidepressants - SSRI Failed - 05/20/2019  5:02 AM      Failed - Valid encounter within last 6 months    Recent Outpatient Visits          5 months ago Annual physical exam   [facility] [place], Alessandra [place], [place]   1 year ago Anxiety   [facility] [place], [place], [place]   2 years ago GAD (generalized anxiety disorder)   [facility] [place], [place], [place]   4 years ago Acute anxiety   [facility] [place], [place]

## 2019-05-31 ENCOUNTER — Ambulatory Visit: Payer: BC Managed Care – PPO | Attending: Internal Medicine

## 2019-05-31 DIAGNOSIS — Z23 Encounter for immunization: Secondary | ICD-10-CM

## 2019-05-31 NOTE — Progress Notes (Signed)
   Covid-19 Vaccination Clinic  Name:  Alexis Myers    MRN: [id number] DOB: 06-03-1995  05/31/2019  [name] was observed post Covid-19 immunization for 15 minutes without incident. She was provided with Vaccine Information Sheet and instruction to access the V-Safe system.   [name] was instructed to call 911 with any severe reactions post vaccine: Marland Kitchen Difficulty breathing  . Swelling of face and throat  . A fast heartbeat  . A bad rash all over body  . Dizziness and weakness   Immunizations Administered    Name Date Dose VIS Date Route   Pfizer COVID-19 Vaccine 05/31/2019  2:46 PM 0.3 mL 02/13/2019 Intramuscular   Manufacturer: [employer], [employer]   Lot: [id number]   NDC: [address]-7

## 2019-06-23 ENCOUNTER — Ambulatory Visit: Payer: BC Managed Care – PPO | Attending: Internal Medicine

## 2019-06-23 DIAGNOSIS — Z23 Encounter for immunization: Secondary | ICD-10-CM

## 2019-06-23 NOTE — Progress Notes (Signed)
   Covid-19 Vaccination Clinic  Name:  Loretto Belinsky    MRN: [id number] DOB: March 17, 1995  06/23/2019  Ms. Northrup was observed post Covid-19 immunization for 15 minutes without incident. She was provided with Vaccine Information Sheet and instruction to access the V-Safe system.   Ms. Duchemin was instructed to call 911 with any severe reactions post vaccine: Marland Kitchen Difficulty breathing  . Swelling of face and throat  . A fast heartbeat  . A bad rash all over body  . Dizziness and weakness   Immunizations Administered    Name Date Dose VIS Date Route   Pfizer COVID-19 Vaccine 06/23/2019  3:16 PM 0.3 mL 04/29/2018 Intramuscular   Manufacturer: [employer], [employer]   Lot: [id number]   NDC: [address]-0

## 2019-08-18 ENCOUNTER — Other Ambulatory Visit: Payer: Self-pay | Admitting: Physician Assistant

## 2019-08-18 DIAGNOSIS — F419 Anxiety disorder, unspecified: Secondary | ICD-10-CM

## 2019-08-18 NOTE — Telephone Encounter (Signed)
Requested medications are due for refill today?   Yes   Requested medications are on active medication list?  Yes  Last Refill:  05/20/2019  # 90 with no refills.  Notation made requesting patient to schedule an office visit.    Future visit scheduled?  No   Notes to Clinic:  Medication failed RX refill protocol due to no valid encounter in the past 6 months.  Last visit was 8 months ago.

## 2019-11-16 ENCOUNTER — Telehealth: Payer: Self-pay

## 2019-11-16 NOTE — Telephone Encounter (Signed)
LMTCB-if patient calls back ok for [facility] nurse to give patient provider message.

## 2019-11-16 NOTE — Telephone Encounter (Signed)
Copied from CRM [id number]. Topic: Quick Communication - See Telephone Encounter >> Nov 16, 2019 11:48 AM Aretta Nip wrote: CRM for notification. See Telephone encounter for: 11/16/19.PARoxetine (PAXIL) 10 MG tablet Pt has made a CPE appt for 10/11 but wanted to at least spk to nurse as this script not due for refill but pt does not feel that it is really affecting her at all, different script, up in dosage? Just like to try something else before appt so can discuss [phone]

## 2019-11-16 NOTE — Telephone Encounter (Signed)
If she has enough she can increase to 20mg  (take 2 tabs together at same time)

## 2019-11-18 NOTE — Telephone Encounter (Signed)
Patient advised as directed below. 

## 2019-12-14 ENCOUNTER — Ambulatory Visit (INDEPENDENT_AMBULATORY_CARE_PROVIDER_SITE_OTHER): Payer: BC Managed Care – PPO | Admitting: Physician Assistant

## 2019-12-14 ENCOUNTER — Encounter: Payer: Self-pay | Admitting: Physician Assistant

## 2019-12-14 ENCOUNTER — Other Ambulatory Visit: Payer: Self-pay

## 2019-12-14 VITALS — BP 135/76 | HR 96 | Temp 98.1°F | Resp 16 | Ht 62.0 in | Wt 172.6 lb

## 2019-12-14 DIAGNOSIS — Z1159 Encounter for screening for other viral diseases: Secondary | ICD-10-CM | POA: Diagnosis not present

## 2019-12-14 DIAGNOSIS — E6609 Other obesity due to excess calories: Secondary | ICD-10-CM | POA: Diagnosis not present

## 2019-12-14 DIAGNOSIS — Z0000 Encounter for general adult medical examination without abnormal findings: Secondary | ICD-10-CM | POA: Diagnosis not present

## 2019-12-14 DIAGNOSIS — Z6831 Body mass index (BMI) 31.0-31.9, adult: Secondary | ICD-10-CM

## 2019-12-14 DIAGNOSIS — Z114 Encounter for screening for human immunodeficiency virus [HIV]: Secondary | ICD-10-CM

## 2019-12-14 NOTE — Patient Instructions (Signed)
Health Maintenance, Female Adopting a healthy lifestyle and getting preventive care are important in promoting health and wellness. Ask your health care provider about:  The right schedule for you to have regular tests and exams.  Things you can do on your own to prevent diseases and keep yourself healthy. What should I know about diet, weight, and exercise? Eat a healthy diet   Eat a diet that includes plenty of vegetables, fruits, low-fat dairy products, and lean protein.  Do not eat a lot of foods that are high in solid fats, added sugars, or sodium. Maintain a healthy weight Body mass index (BMI) is used to identify weight problems. It estimates body fat based on height and weight. Your health care provider can help determine your BMI and help you achieve or maintain a healthy weight. Get regular exercise Get regular exercise. This is one of the most important things you can do for your health. Most adults should:  Exercise for at least 150 minutes each week. The exercise should increase your heart rate and make you sweat (moderate-intensity exercise).  Do strengthening exercises at least twice a week. This is in addition to the moderate-intensity exercise.  Spend less time sitting. Even light physical activity can be beneficial. Watch cholesterol and blood lipids Have your blood tested for lipids and cholesterol at 26 years of age, then have this test every 5 years. Have your cholesterol levels checked more often if:  Your lipid or cholesterol levels are high.  You are older than 26 years of age.  You are at high risk for heart disease. What should I know about cancer screening? Depending on your health history and family history, you may need to have cancer screening at various ages. This may include screening for:  Breast cancer.  Cervical cancer.  Colorectal cancer.  Skin cancer.  Lung cancer. What should I know about heart disease, diabetes, and high blood  pressure? Blood pressure and heart disease  High blood pressure causes heart disease and increases the risk of stroke. This is more likely to develop in people who have high blood pressure readings, are of African descent, or are overweight.  Have your blood pressure checked: ? Every 3-5 years if you are 18-39 years of age. ? Every year if you are 40 years old or older. Diabetes Have regular diabetes screenings. This checks your fasting blood sugar level. Have the screening done:  Once every three years after age 40 if you are at a normal weight and have a low risk for diabetes.  More often and at a younger age if you are overweight or have a high risk for diabetes. What should I know about preventing infection? Hepatitis B If you have a higher risk for hepatitis B, you should be screened for this virus. Talk with your health care provider to find out if you are at risk for hepatitis B infection. Hepatitis C Testing is recommended for:  Everyone born from 1945 through 1965.  Anyone with known risk factors for hepatitis C. Sexually transmitted infections (STIs)  Get screened for STIs, including gonorrhea and chlamydia, if: ? You are sexually active and are younger than 26 years of age. ? You are older than 26 years of age and your health care provider tells you that you are at risk for this type of infection. ? Your sexual activity has changed since you were last screened, and you are at increased risk for chlamydia or gonorrhea. Ask your health care provider if   you are at risk.  Ask your health care provider about whether you are at high risk for HIV. Your health care provider may recommend a prescription medicine to help prevent HIV infection. If you choose to take medicine to prevent HIV, you should first get tested for HIV. You should then be tested every 3 months for as long as you are taking the medicine. Pregnancy  If you are about to stop having your period (premenopausal) and  you may become pregnant, seek counseling before you get pregnant.  Take 400 to 800 micrograms (mcg) of folic acid every day if you become pregnant.  Ask for birth control (contraception) if you want to prevent pregnancy. Osteoporosis and menopause Osteoporosis is a disease in which the bones lose minerals and strength with aging. This can result in bone fractures. If you are 65 years old or older, or if you are at risk for osteoporosis and fractures, ask your health care provider if you should:  Be screened for bone loss.  Take a calcium or vitamin D supplement to lower your risk of fractures.  Be given hormone replacement therapy (HRT) to treat symptoms of menopause. Follow these instructions at home: Lifestyle  Do not use any products that contain nicotine or tobacco, such as cigarettes, e-cigarettes, and chewing tobacco. If you need help quitting, ask your health care provider.  Do not use street drugs.  Do not share needles.  Ask your health care provider for help if you need support or information about quitting drugs. Alcohol use  Do not drink alcohol if: ? Your health care provider tells you not to drink. ? You are pregnant, may be pregnant, or are planning to become pregnant.  If you drink alcohol: ? Limit how much you use to 0-1 drink a day. ? Limit intake if you are breastfeeding.  Be aware of how much alcohol is in your drink. In the [place] one drink equals one 12 oz bottle of beer (355 mL), one 5 oz glass of wine (148 mL), or one 1 oz glass of hard liquor (44 mL). General instructions  Schedule regular health, dental, and eye exams.  Stay current with your vaccines.  Tell your health care provider if: ? You often feel depressed. ? You have ever been abused or do not feel safe at home. Summary  Adopting a healthy lifestyle and getting preventive care are important in promoting health and wellness.  Follow your health care provider's instructions about healthy  diet, exercising, and getting tested or screened for diseases.  Follow your health care provider's instructions on monitoring your cholesterol and blood pressure. This information is not intended to replace advice given to you by your health care provider. Make sure you discuss any questions you have with your health care provider. Document Revised: 02/12/2018 Document Reviewed: 02/12/2018 Elsevier Patient Education  2020 [employer] Inc.  

## 2019-12-14 NOTE — Progress Notes (Signed)
Complete physical exam   Patient: Alexis Myers   DOB: Apr 29, 1995   24 y.o. Female  MRN: [id number] Visit Date: 12/14/2019  Today's healthcare provider: Mar Daring, PA-C   Chief Complaint  Patient presents with   Annual Exam   Subjective    Alexis Myers is a 24 y.o. female who presents today for a complete physical exam.  She reports consuming a general diet. Home exercise routine includes strength classes and light weights and walks. She generally feels well. She reports sleeping well. She does not have additional problems to discuss today.  HPI  Patient went to [facility] and had pap last year. Records requested. Pap updated in [facility].  History reviewed. No pertinent past medical history. Past Surgical History:  Procedure Laterality Date   NO PAST SURGERIES     Social History   Socioeconomic History   Marital status: Single    Spouse name: Not on file   Number of children: Not on file   Years of education: Not on file   Highest education level: Not on file  Occupational History   Occupation: Full Time Student at [employer]  Tobacco Use   Smoking status: Never Smoker   Smokeless tobacco: Never Used  Substance and Sexual Activity   Alcohol use: No   Drug use: No   Sexual activity: Not on file  Other Topics Concern   Not on file  Social History Narrative   Not on file   Social Determinants of Health   Financial Resource Strain:    Difficulty of Paying Living Expenses: Not on file  Food Insecurity:    Worried About Charity fundraiser in the Last Year: Not on file   [employer] of Food in the Last Year: Not on file  Transportation Needs:    Lack of Transportation (Medical): Not on file   Lack of Transportation (Non-Medical): Not on file  Physical Activity:    Days of Exercise per Week: Not on file   Minutes of Exercise per Session: Not on file  Stress:    Feeling of Stress : Not on file  Social  Connections:    Frequency of Communication with Friends and Family: Not on file   Frequency of Social Gatherings with Friends and Family: Not on file   Attends Religious Services: Not on file   Active Member of Clubs or Organizations: Not on file   Attends Archivist Meetings: Not on file   Marital Status: Not on file  Intimate Partner Violence:    Fear of Current or Ex-Partner: Not on file   Emotionally Abused: Not on file   Physically Abused: Not on file   Sexually Abused: Not on file   Family Status  Relation Name Status   Mother  Alive   Father  Alive   Sister  Alive   Other  (Not Specified)   Family History  Problem Relation Age of Onset   Healthy Mother    Healthy Father    Healthy Sister    Diabetes Other    Hypertension Other    No Known Allergies  Patient Care Team: Mar Daring, PA-C as PCP - General (Family Medicine)   Medications: Outpatient Medications Prior to Visit  Medication Sig   PARoxetine (PAXIL) 10 MG tablet TAKE 1 TABLET BY MOUTH EVERY DAY   No facility-administered medications prior to visit.    Review of Systems  Constitutional: Negative.  HENT: Negative.   Eyes: Negative.   Respiratory: Negative.   Cardiovascular: Negative.   Gastrointestinal: Negative.   Endocrine: Negative.   Genitourinary: Negative.   Musculoskeletal: Negative.   Skin: Negative.   Allergic/Immunologic: Negative.   Neurological: Negative.   Hematological: Negative.   Psychiatric/Behavioral: Negative.       Objective    BP 135/76 (BP Location: Left Arm, Patient Position: Sitting, Cuff Size: Large)    Pulse 96    Temp 98.1 F (36.7 C) (Oral)    Resp 16    Ht _0  (1.575 m)    Wt 172 lb 9.6 oz (78.3 kg)    LMP 11/23/2019    BMI 31.57 kg/m    Physical Exam Vitals reviewed.  Constitutional:      General: She is not in acute distress.    Appearance: Normal appearance. She is well-developed. She is obese. She is not  ill-appearing or diaphoretic.  HENT:     Head: Normocephalic and atraumatic.     Right Ear: Tympanic membrane, ear canal and external ear normal.     Left Ear: Tympanic membrane, ear canal and external ear normal.  Eyes:     General: No scleral icterus.       Right eye: No discharge.        Left eye: No discharge.     Extraocular Movements: Extraocular movements intact.     Conjunctiva/sclera: Conjunctivae normal.     Pupils: Pupils are equal, round, and reactive to light.  Neck:     Thyroid: No thyromegaly.     Vascular: No JVD.     Trachea: No tracheal deviation.  Cardiovascular:     Rate and Rhythm: Normal rate and regular rhythm.     Pulses: Normal pulses.     Heart sounds: Normal heart sounds. No murmur heard.  No friction rub. No gallop.   Pulmonary:     Effort: Pulmonary effort is normal. No respiratory distress.     Breath sounds: Normal breath sounds. No wheezing or rales.  Chest:     Chest wall: No tenderness.  Abdominal:     General: Abdomen is flat. Bowel sounds are normal. There is no distension.     Palpations: Abdomen is soft. There is no mass.     Tenderness: There is no abdominal tenderness. There is no guarding or rebound.  Genitourinary:    Comments: Deferred to GYN Musculoskeletal:        General: No tenderness. Normal range of motion.     Cervical back: Normal range of motion and neck supple. No tenderness.     Right lower leg: No edema.     Left lower leg: No edema.  Lymphadenopathy:     Cervical: No cervical adenopathy.  Skin:    General: Skin is warm and dry.     Capillary Refill: Capillary refill takes less than 2 seconds.  Neurological:     General: No focal deficit present.     Mental Status: She is alert and oriented to person, place, and time. Mental status is at baseline.  Psychiatric:        Mood and Affect: Mood normal.        Behavior: Behavior normal.        Thought Content: Thought content normal.        Judgment: Judgment normal.      Last depression screening scores PHQ 2/9 Scores 12/14/2019 11/28/2018 11/18/2017  PHQ - 2 Score 0 0 0  PHQ- 9  Score - 0 -   Last fall risk screening Fall Risk  12/14/2019  Falls in the past year? 0  Number falls in past yr: 0  Injury with Fall? 0  Risk for fall due to : No Fall Risks  Follow up Falls evaluation completed   Last Audit-C alcohol use screening Alcohol Use Disorder Test (AUDIT) 12/14/2019  1. How often do you have a drink containing alcohol? 1  2. How many drinks containing alcohol do you have on a typical day when you are drinking? 0  3. How often do you have six or more drinks on one occasion? 0  AUDIT-C Score 1  Alcohol Brief Interventions/Follow-up AUDIT Score <7 follow-up not indicated   A score of 3 or more in women, and 4 or more in men indicates increased risk for alcohol abuse, EXCEPT if all of the points are from question 1   No results found for any visits on 12/14/19.  Assessment & Plan    Routine Health Maintenance and Physical Exam  Exercise Activities and Dietary recommendations Goals   None     Immunization History  Administered Date(s) Administered   DTaP 11/22/1993, 01/24/1994, 03/28/1994, 01/08/1995, 10/03/1998   HPV Quadrivalent 01/15/2005, 03/19/2005, 07/17/2005   Hepatitis A 02/03/2007, 07/27/2011   Hepatitis B 02-04-94, 11/22/1993, 06/28/1994   HiB (PRP-OMP) 11/22/1993, 01/24/1994, 03/28/1994, 01/08/1995   IPV 11/22/1993, 01/24/1994, 03/28/1994, 10/03/1998   Influenza,inj,Quad PF,6+ Mos 11/28/2018   Influenza-Unspecified 12/17/2013, 12/25/2014, 12/06/2016, 12/10/2017   MMR 09/27/1994, 10/03/1998   Meningococcal Conjugate 07/27/2011   PFIZER SARS-COV-2 Vaccination 05/31/2019, 06/23/2019   Td 02/03/2007   Tdap 02/03/2007, 12/10/2017   Varicella 01/08/1995, 02/03/2007    Health Maintenance  Topic Date Due   Hepatitis C Screening  Never done   PAP-Cervical Cytology Screening  Never done   PAP  SMEAR-Modifier  Never done   INFLUENZA VACCINE  10/04/2019   TETANUS/TDAP  12/11/2027   COVID-19 Vaccine  Completed   HIV Screening  Completed    Discussed health benefits of physical activity, and encouraged her to engage in regular exercise appropriate for her age and condition.  1. Annual physical exam Normal physical exam today. Will check labs as below and f/u pending lab results. If labs are stable and WNL she will not need to have these rechecked for one year at her next annual physical exam. She is to call the office in the meantime if she has any acute issue, questions or concerns. - CBC with Differential/Platelet - Comprehensive metabolic panel - Lipid panel - TSH  2. Need for hepatitis C screening test Will check labs as below and f/u pending results. - Hepatitis C antibody  3. Screening for HIV without presence of risk factors Will check labs as below and f/u pending results. - HIV Antibody (routine testing w rflx)  4. Class 1 obesity due to excess calories with serious comorbidity and body mass index (BMI) of 31.0 to 31.9 in adult Counseled patient on healthy lifestyle modifications including dieting and exercise. Will check labs as below and f/u pending results. - CBC with Differential/Platelet - Comprehensive metabolic panel - Lipid panel - TSH   No follow-ups on file.     Reynolds Bowl, PA-C, have reviewed all documentation for this visit. The documentation on 12/14/19 for the exam, diagnosis, procedures, and orders are all accurate and complete.   Rubye Beach  [facility] [phone] (phone) [phone] (fax)  [place]

## 2019-12-15 DIAGNOSIS — E6609 Other obesity due to excess calories: Secondary | ICD-10-CM | POA: Diagnosis not present

## 2019-12-15 DIAGNOSIS — Z0000 Encounter for general adult medical examination without abnormal findings: Secondary | ICD-10-CM | POA: Diagnosis not present

## 2019-12-15 DIAGNOSIS — Z1159 Encounter for screening for other viral diseases: Secondary | ICD-10-CM | POA: Diagnosis not present

## 2019-12-15 DIAGNOSIS — Z114 Encounter for screening for human immunodeficiency virus [HIV]: Secondary | ICD-10-CM | POA: Diagnosis not present

## 2019-12-15 DIAGNOSIS — Z6831 Body mass index (BMI) 31.0-31.9, adult: Secondary | ICD-10-CM | POA: Diagnosis not present

## 2019-12-16 ENCOUNTER — Encounter: Payer: Self-pay | Admitting: Physician Assistant

## 2019-12-16 DIAGNOSIS — Z6831 Body mass index (BMI) 31.0-31.9, adult: Secondary | ICD-10-CM

## 2019-12-16 DIAGNOSIS — K760 Fatty (change of) liver, not elsewhere classified: Secondary | ICD-10-CM

## 2019-12-16 DIAGNOSIS — E6609 Other obesity due to excess calories: Secondary | ICD-10-CM

## 2019-12-16 LAB — COMPREHENSIVE METABOLIC PANEL
ALT: 263 IU/L — ABNORMAL HIGH (ref 0–32)
AST: 128 IU/L — ABNORMAL HIGH (ref 0–40)
Albumin/Globulin Ratio: 1.5 (ref 1.2–2.2)
Albumin: 4.6 g/dL (ref 3.9–5.0)
Alkaline Phosphatase: 133 IU/L — ABNORMAL HIGH (ref 44–121)
BUN/Creatinine Ratio: 12 (ref 9–23)
BUN: 10 mg/dL (ref 6–20)
Bilirubin Total: 0.5 mg/dL (ref 0.0–1.2)
CO2: 21 mmol/L (ref 20–29)
Calcium: 9.6 mg/dL (ref 8.7–10.2)
Chloride: 101 mmol/L (ref 96–106)
Creatinine, Ser: 0.84 mg/dL (ref 0.57–1.00)
GFR calc Af Amer: 111 mL/min/{1.73_m2} (ref >59)
GFR calc non Af Amer: 96 mL/min/{1.73_m2} (ref >59)
Globulin, Total: 3.1 g/dL (ref 1.5–4.5)
Glucose: 89 mg/dL (ref 65–99)
Potassium: 4.1 mmol/L (ref 3.5–5.2)
Sodium: 137 mmol/L (ref 134–144)
Total Protein: 7.7 g/dL (ref 6.0–8.5)

## 2019-12-16 LAB — CBC WITH DIFFERENTIAL/PLATELET
Basophils Absolute: 0 10*3/uL (ref 0.0–0.2)
Basos: 1 %
EOS (ABSOLUTE): 0.3 10*3/uL (ref 0.0–0.4)
Eos: 6 %
Hematocrit: 43.2 % (ref 34.0–46.6)
Hemoglobin: 14.6 g/dL (ref 11.1–15.9)
Immature Grans (Abs): 0 10*3/uL (ref 0.0–0.1)
Immature Granulocytes: 0 %
Lymphocytes Absolute: 1.9 10*3/uL (ref 0.7–3.1)
Lymphs: 36 %
MCH: 31.4 pg (ref 26.6–33.0)
MCHC: 33.8 g/dL (ref 31.5–35.7)
MCV: 93 fL (ref 79–97)
Monocytes Absolute: 0.6 10*3/uL (ref 0.1–0.9)
Monocytes: 11 %
Neutrophils Absolute: 2.4 10*3/uL (ref 1.4–7.0)
Neutrophils: 46 %
Platelets: 197 10*3/uL (ref 150–450)
RBC: 4.65 x10E6/uL (ref 3.77–5.28)
RDW: 12.6 % (ref 11.7–15.4)
WBC: 5.3 10*3/uL (ref 3.4–10.8)

## 2019-12-16 LAB — TSH: TSH: 4.12 u[IU]/mL (ref 0.450–4.500)

## 2019-12-16 LAB — HEPATITIS C ANTIBODY: Hep C Virus Ab: 0.2 s/co ratio (ref 0.0–0.9)

## 2019-12-16 LAB — LIPID PANEL
Chol/HDL Ratio: 4.4 ratio (ref 0.0–4.4)
Cholesterol, Total: 271 mg/dL — ABNORMAL HIGH (ref 100–199)
HDL: 62 mg/dL (ref >39)
LDL Chol Calc (NIH): 182 mg/dL — ABNORMAL HIGH (ref 0–99)
Triglycerides: 151 mg/dL — ABNORMAL HIGH (ref 0–149)
VLDL Cholesterol Cal: 27 mg/dL (ref 5–40)

## 2019-12-16 LAB — HIV ANTIBODY (ROUTINE TESTING W REFLEX): HIV Screen 4th Generation wRfx: NONREACTIVE

## 2019-12-30 ENCOUNTER — Encounter: Payer: BC Managed Care – PPO | Attending: Physician Assistant | Admitting: Dietician

## 2019-12-30 ENCOUNTER — Encounter: Payer: Self-pay | Admitting: Dietician

## 2019-12-30 ENCOUNTER — Other Ambulatory Visit: Payer: Self-pay

## 2019-12-30 VITALS — Ht 62.0 in | Wt 173.0 lb

## 2019-12-30 DIAGNOSIS — K760 Fatty (change of) liver, not elsewhere classified: Secondary | ICD-10-CM

## 2019-12-30 DIAGNOSIS — E669 Obesity, unspecified: Secondary | ICD-10-CM

## 2019-12-30 DIAGNOSIS — Z6831 Body mass index (BMI) 31.0-31.9, adult: Secondary | ICD-10-CM

## 2019-12-30 NOTE — Progress Notes (Signed)
Medical Nutrition Therapy: Visit start time: 0900  end time: 1030  Assessment:  Diagnosis: hepatic steatosis, obesity  Past medical history: high cholesterol, anxiety  Psychosocial issues/ stress concerns: pt rates stress level as "moderate" and feels "very well/ok" about stress management skills   Preferred learning method:  . Auditory . Visual . Hands-on  Current weight: 173.0 lbs  Height: 5'2" Medications, supplements: reconciled in medical record  Labs- total cholesterol 271(H), triglycerides 151(H),  LDL 182(H), AST 133(H), ALT 263(H)  Progress and evaluation:   Pt states her main goal is to learn how to adopt healthier eating habits   Pt reports good/great sleep quality, 7+ hours a night   Pt reports regular BM- 1-2 BM/day    Pt notes low F/V intake- estimates 0 low-carb veggies a day, 1 serving of fruit/day  Physical activity: 30-50 min walking and strength training, 2-4 times a week   Dietary Intake:  Usual eating pattern includes 3 meals and 1-2 snacks per day. Dining out frequency: 3-6 meals per week.  Breakfast: cheerios with 2% milk; protein bar; shake; scrambled eggs; mini bagel with cream cheese; Smoothie (fruit, spinach, juice) Lunch: [place] with cheese sandwich with popcorn, chips  Snack: gummies, fun size m&ms  Supper: beef/chicken tacos, bowl (rice, pico, meat, ) pasta dishes (tortellini) with green beans or sweet potatos; dining out (cauliflower mac with chicken, papa john's pizza, chik-fik-a tenders with mac) Snack: couple of oreos, icecream  Beverages: latte with milk and sugar free syrup, 80-120 oz water     Nutrition Care Education: Basic nutrition: basic food groups, appropriate nutrient balance, appropriate meal and snack schedule, general nutrition guidelines    Weight control: importance of low sugar and low fat choices Advanced nutrition: cooking techniques, dining out Fatty liver/Hyperlidipemia:  healthy and unhealthy fats, role of fiber, plant  sterols, role of exercise  Nutritional Diagnosis:  NB-1.1 Food and nutrition-related knowledge deficit As related to new diagnosis of fatty liver and high cholesterol .  As evidenced by pt discussion and diet recall . [place]-2.2 Altered nutrition-related laboratory As related to new diagnosis of fatty liver.  As evidenced by AST 128(H), ALT 263(H).  Intervention:  Discussion and instruction as noted above.  Worked with pt do design meal and snack ideas that work with lifestyle.  Pt identified she likes fruits and vegetables but does not usually feel like dealing with the time it takes to prep produce.  Discussed techniques for meal prepping that can save time and increase F/V intake.  Established goals for additional change.   Increase fruit and vegetable intake   Incorporate vegetables at lunch and dinner   Incorporate more F/V at snacks   Stress management   Recommend meeting with Pastoral Care, pt given contact number   Increase quality of sleep, at follow up discuss link between sleep and weight management   At follow up, discuss Mindful Eating handout    Maintain physical activity   150 minutes per week is recommendation    Increase fiber intake   Eat at least 3 servings of whole grains a day  Increase fruit and vegetable intake  Continue great fluid intake   Drink at least 64oz water daily   Decrease sodium intake   Reduce sodium intake to recommended 1525m/day   Read nutrition labels on packaged foods to monitor sodium intake   400-600 mg/meal  <200 mg/snack   Decrease fast food frequency   Avoid processed meats    Decrease saturated fat intake  Try more plant-based sources of protein  Limit processed meats   Switch to low fat dairy products   Education Materials given:  Marland Kitchen Mediterranean diet handout . My Personal meal plan  . Plate Planner with food lists  . Recipes- oldways med diet  . Goals/ instructions  Learner/ who was taught:  . Patient    Level of understanding: Marland Kitchen Verbalizes/ demonstrates competency  Demonstrated degree of understanding via:   Teach back Learning barriers: . None  Willingness to learn/ readiness for change: . Acceptance, ready for change  Monitoring and Evaluation:  Dietary intake, exercise, liver enzymes, and body weight      follow up: December 15 at [facility]

## 2019-12-30 NOTE — Patient Instructions (Signed)
   Choose more balanced snacks: carb + protein   Add in more vegetables at lunch and dinner   When dining out, use some of the techniques we discussed

## 2020-02-17 ENCOUNTER — Encounter: Payer: Self-pay | Admitting: Dietician

## 2020-02-17 ENCOUNTER — Other Ambulatory Visit: Payer: Self-pay

## 2020-02-17 ENCOUNTER — Encounter: Payer: BC Managed Care – PPO | Attending: Physician Assistant | Admitting: Dietician

## 2020-02-17 VITALS — Ht 62.0 in | Wt 169.4 lb

## 2020-02-17 DIAGNOSIS — E669 Obesity, unspecified: Secondary | ICD-10-CM

## 2020-02-17 DIAGNOSIS — K760 Fatty (change of) liver, not elsewhere classified: Secondary | ICD-10-CM

## 2020-02-17 NOTE — Patient Instructions (Signed)
·   Remember to add a protein source to your smoothies (PB, nuts, greek yogurt, protein powder)  When shopping for convenience foods, check the labels using the criteria we discussed   Continue to experiment with new recipes and vegetables- [email or web address], [email or web address].com   Keep up the great work!

## 2020-02-17 NOTE — Progress Notes (Signed)
Medical Nutrition Therapy: Visit start time: 0900  end time: 0945  Assessment:  Diagnosis: hepatic steatosis, obesity  Medical history changes: none Psychosocial issues/ stress concerns: none  Current weight: 169.4 lbs  Height: 5'2" Medications, supplement changes: reconciled in medical record   Progress and evaluation:  . Pt reports she has made the following changes: o Improving her snacking habits o Increased daily F/V intake; experimenting with new ways to make veggies o Tried one of the yogurt recommendations but did not like it, may try another brand  . Pt states she started a new position with a new company at the beginning of November and she has noticed a big improvement in her mental health; she reports more motivation to exercise, meal prep, and overall better sense of wellbeing  . Pt states she feels confidently about listening to her hunger and fullness cues; usually does not wait til starving to eat and usually does not eat past 'overly full'    Physical activity: 30-50 min walking and strength training, 2-4 times a week  Dietary Intake:  Usual eating pattern includes 3 meals and 1-2 snacks per day. Dining out frequency: not assessed   Breakfast: eating more yogurt; 1 whole egg mixed with egg whites; cheerios with milk  Snack: smoothie (fruit, spinach, juice or milk); cheese with crackers; string cheese; hummus with peppers/pita chips/cucumbers; perfect bar (granola bar); popcorn Lunch: sandwiches ([place] and cheese with cucumbers and tomatoes) with fruit/veggies/chips/crackers on side Snack: same as above Supper: usually protein with roasted veggies; most recently made 'spaghetti squash taco boat' (squash, ground beef, peppers, black beans, cheese) Snack: less cookies and sweets  Beverages: latte with milk and sugar free syrup, 80-120 oz water    Nutrition Care Education: Basic nutrition: appropriate nutrient balance, appropriate meal and snack schedule Weight control:  reviewed progress since previous visit, ways to tracking progress Advanced nutrition: food label reading Fatty liver/Hyperlidipemia:  healthy and unhealthy fats, role of fiber, plant sterols, role of exercise Other lifestyle changes:  benefits of stress management   Nutritional Diagnosis: NB-1.1 Food and nutrition-related knowledge deficit As related to new diagnosis of fatty liver and high cholesterol .  As evidenced by pt discussion and diet recall . [place]-2.2 Altered nutrition-related laboratory As related to new diagnosis of fatty liver.  As evidenced by AST 128(H), ALT 263(H).  Intervention:  Discussion and instruction as noted above.  Praised pt for great progress she has made towards goals since last visit.  Reviewed reading labels to add more variety and convenience to her lifestyle changes.  Established goals for additional change.    Increase fruit and vegetable intake   Incorporate vegetables at lunch and dinner   Incorporate more F/V at snacks  Stress management   Recommend meeting with Pastoral Care, pt given contact number   Increase quality of sleep, at follow up discuss link between sleep and weight management   At follow up, discuss Mindful Eating handout   Maintain physical activity   150 minutes per week is recommendation  Increase fiber intake   Eat at least 3 servings of whole grains a day  Increase fruit and vegetable intake  Continue great fluid intake   Drink at least 64oz water daily   Decrease sodium intake   Reduce sodium intake to recommended 1500mg /day   Read nutrition labels on packaged foods to monitor sodium intake  ? 400-600 mg/meal ? <200 mg/snack   Decrease fast food frequency   Avoid processed meats   Decrease saturated  fat intake   Try more plant-based sources of protein  Limit processed meats   Switch to low fat dairy products  Education Materials given:  Marland Kitchen Mindful eating- hunger scale resource   . Recipes-  thenaturalnurturer.com, [email or web address].com . Goals/ instructions- will send to pt   Learner/ who was taught:  . Patient   Level of understanding: Marland Kitchen Verbalizes/ demonstrates competency  Demonstrated degree of understanding via:   Teach back Learning barriers: . None  Willingness to learn/ readiness for change: . Eager, change in progress  Monitoring and Evaluation:  Dietary intake, exercise, liver enzymes, and body weight      follow up: February 11 at [facility]

## 2020-02-29 ENCOUNTER — Other Ambulatory Visit: Payer: Self-pay | Admitting: Physician Assistant

## 2020-02-29 DIAGNOSIS — F419 Anxiety disorder, unspecified: Secondary | ICD-10-CM

## 2020-03-08 DIAGNOSIS — Z6830 Body mass index (BMI) 30.0-30.9, adult: Secondary | ICD-10-CM | POA: Diagnosis not present

## 2020-03-08 DIAGNOSIS — Z01419 Encounter for gynecological examination (general) (routine) without abnormal findings: Secondary | ICD-10-CM | POA: Diagnosis not present

## 2020-04-15 ENCOUNTER — Encounter: Payer: Self-pay | Admitting: Dietician

## 2020-04-15 ENCOUNTER — Encounter: Payer: BC Managed Care – PPO | Attending: Physician Assistant | Admitting: Dietician

## 2020-04-15 ENCOUNTER — Other Ambulatory Visit: Payer: Self-pay

## 2020-04-15 VITALS — Ht 62.0 in | Wt 168.3 lb

## 2020-04-15 DIAGNOSIS — Z6831 Body mass index (BMI) 31.0-31.9, adult: Secondary | ICD-10-CM | POA: Insufficient documentation

## 2020-04-15 DIAGNOSIS — K760 Fatty (change of) liver, not elsewhere classified: Secondary | ICD-10-CM | POA: Diagnosis not present

## 2020-04-15 DIAGNOSIS — E669 Obesity, unspecified: Secondary | ICD-10-CM | POA: Diagnosis not present

## 2020-04-15 NOTE — Progress Notes (Signed)
Medical Nutrition Therapy: Visit start time: 0900  end time: 0930  Assessment:  Diagnosis: hepatic steatosis, obesity Medical history changes: no changes Psychosocial issues/ stress concerns: none  Current weight: 168.3lbs with shoes, sweatshirt Height: 5'2"   BMI 30.78 Medications, supplement changes: no changes  Progress and evaluation:  . Loss of about 1lb since previous visit on 02/17/20 . Patient feels she has been craving more sweets since holidays. She continues to limit intake, often having fruit/ smoothie or water rather than cookies or other sweet. . She has been increasing variety of vegetables eaten, finding more healthy foods she can enjoy, ie roasted green beans.   Physical activity: [place] app strength training 3x a week; walking 30 minutes or more 3-5x a week  Dietary Intake:  Usual eating pattern includes 3 meals and 1-2 snacks per day. Dining out frequency: 2-3 meals per week.  Breakfast: yogurt; 1 egg + egg whites occ with whole grain toast; cheerios with milk Snack: smoothie (fruit, spinach, juice or milk); cheese with crackers; string cheese; hummus with peppers/pita chips/cucumbers; perfect bar (granola bar); popcorn Lunch: sandwiches ([place] and cheese with cucumbers and tomatoes) with fruit/veggies/chips/crackers on side Snack: same as am or none Supper: protein chicken/ lean beef/ ground [place] + roasted veggies; limits carbs to 1 food per meal; does not like salad Snack: occ protein bar; fruit; or just water to overcome craving for something sweet Beverages: water, latte with skim or 2% milk, sf syrup, soda maybe once a week  Nutrition Care Education: Topics covered:     Weight control: reviewed progress since previous visit, managing food cravings by diverting thoughts, allowing occasional "treats" or daily small treats, choosing healthy alternatives; discussed importance of arriving at a plan that works personally Advanced nutrition:  recipe ideas, cooking  techniques Hepatic steatosis: reviewed importance of limiting sugars and refined starches, as well as unhealthy fats; benefit of regular exercise and weight management   Nutritional Diagnosis:  [place]-2.2 Altered nutrition-related laboratory As related to hepatic steatosis.  As evidenced by recent elevated liver function tests.  Intervention:  . Instruction and discussion as noted above. . Patient continues to make progress with healthy food choices, increasing whole plant foods and limiting processed carbs and unhealthy fats. She is engaging in exercise on a regular basis; she is motivated to continue with her healthy habits. . No further follow-up scheduled at this time; patient to schedule later if needed.  Education Materials given:  Marland Kitchen Tips for Managing Food Cravings . Visit summary with goals/ instructions   Learner/ who was taught:  . Patient   Level of understanding: Marland Kitchen Verbalizes/ demonstrates competency  Demonstrated degree of understanding via:   Teach back Learning barriers: . None  Willingness to learn/ readiness for change: . Eager, change in progress  Monitoring and Evaluation:  Dietary intake, exercise, liver function tests, and body weight      follow up: prn

## 2020-04-15 NOTE — Patient Instructions (Signed)
   Great job following a healthy eating pattern and managing food cravings! Keep up the awesome work!  Continue regular exercise to maintain healthy metabolism, lower cholesterol, and keep liver healthy.   If more recipes/ ideas are needed, check oldwayspt.org (mediterranean eating style)

## 2020-06-02 ENCOUNTER — Other Ambulatory Visit: Payer: Self-pay | Admitting: Physician Assistant

## 2020-06-02 DIAGNOSIS — F419 Anxiety disorder, unspecified: Secondary | ICD-10-CM

## 2020-09-04 ENCOUNTER — Other Ambulatory Visit: Payer: Self-pay | Admitting: Physician Assistant

## 2020-09-04 DIAGNOSIS — F419 Anxiety disorder, unspecified: Secondary | ICD-10-CM

## 2020-09-08 ENCOUNTER — Telehealth: Payer: Self-pay

## 2020-09-08 DIAGNOSIS — Z6830 Body mass index (BMI) 30.0-30.9, adult: Secondary | ICD-10-CM | POA: Diagnosis not present

## 2020-09-08 DIAGNOSIS — H6090 Unspecified otitis externa, unspecified ear: Secondary | ICD-10-CM | POA: Diagnosis not present

## 2020-09-08 DIAGNOSIS — F419 Anxiety disorder, unspecified: Secondary | ICD-10-CM

## 2020-09-08 MED ORDER — PAROXETINE HCL 10 MG PO TABS
10.0000 mg | ORAL_TABLET | Freq: Every day | ORAL | 0 refills | Status: AC
Start: 2020-09-08 — End: ?

## 2020-09-08 NOTE — Telephone Encounter (Signed)
Copied from CRM [id number]. Topic: Quick Communication - Rx Refill/Question >> Sep 08, 2020 11:36 AM Aretta Nip wrote: Medication: TAKE 1 TABLET BY MOUTH EVERY DAY, Normal Dispense: 90 tablet Refills: 0 ordered TAKE 1 TABLET BY MOUTH EVERY DAY, Normal Dispense: 90 tablet Refills: 0 ordered  Pt wants 1 refill/ waiting to be sch with Merita Norton mid  July!  Has the patient contacted their pharmacy? Yes.   (Agent: If no, request that the patient contact the pharmacy for the refill.) call dr (Agent: If yes, when and what did the pharmacy advise?)  Preferred Pharmacy (with phone number or street name): TAKE 1 TABLET BY MOUTH EVERY DAY, Normal Dispense: 90 tablet Refills: 0 ordered Pharmacy: [id number] - [place], [place] - [address] AT [address] [facility] (Ph: [phone])    Agent: Please be advised that RX refills may take up to 3 business days. We ask that you follow-up with your pharmacy.

## 2020-09-12 IMAGING — US US ABDOMEN LIMITED
1 series · 14 of 25 positions shown · non-contrast
Comparison: None.

CLINICAL DATA: Elevated liver enzymes

EXAM:
ULTRASOUND ABDOMEN LIMITED RIGHT UPPER QUADRANT

[Series 1: us abdomen limited · 0.20mm/px · 14 of 78 slices shown]
[im 1/78]
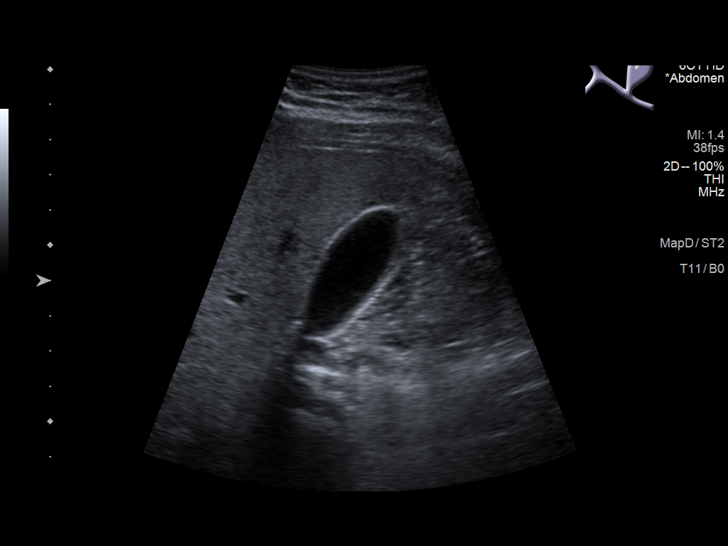
[im 7/78]
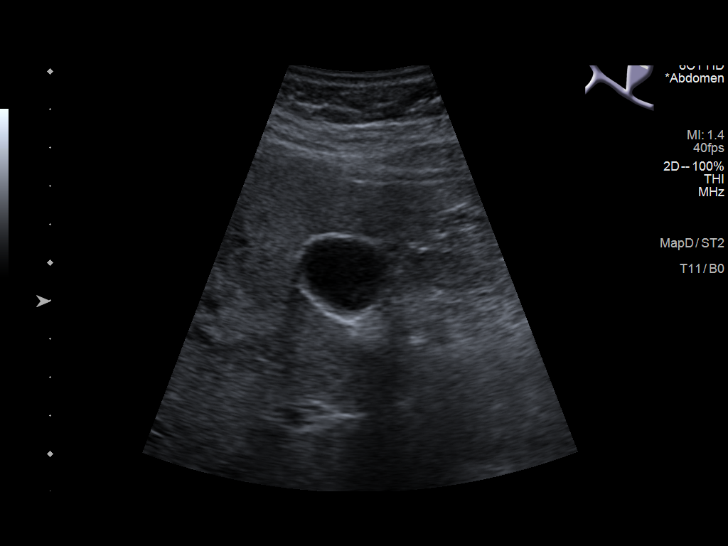
[im 13/78]
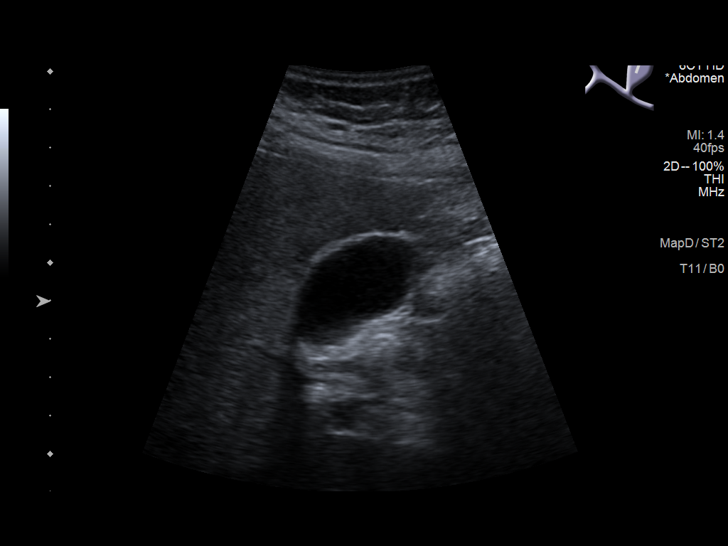
[im 20/78]
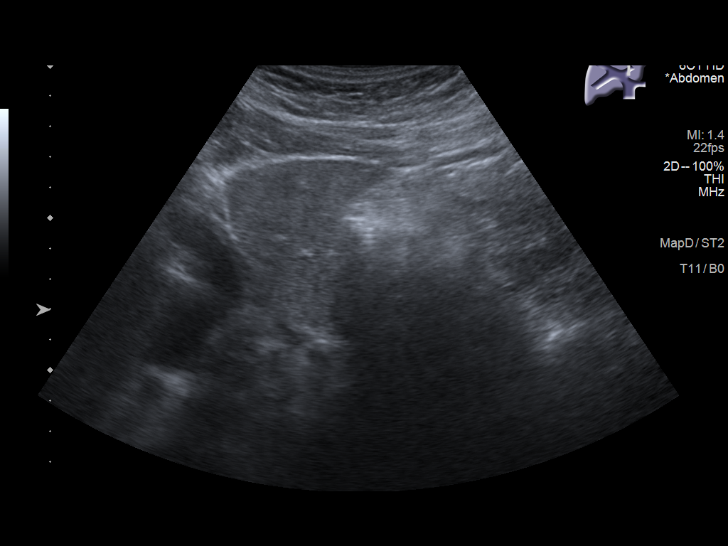
[im 26/78]
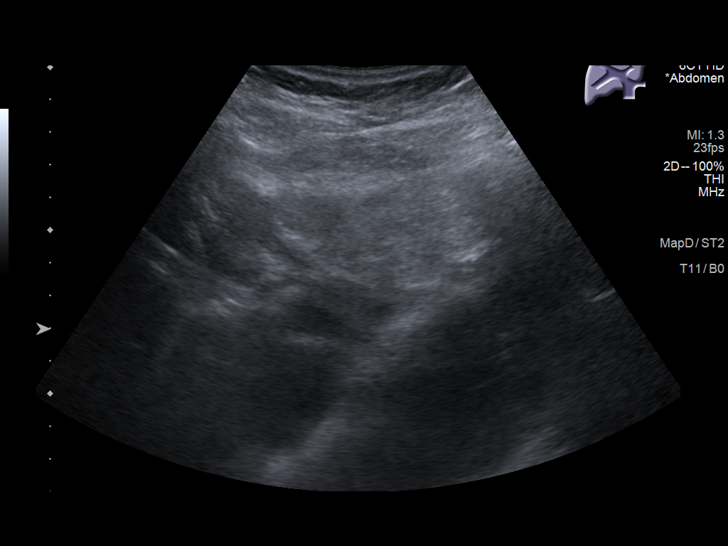
[im 29/78]
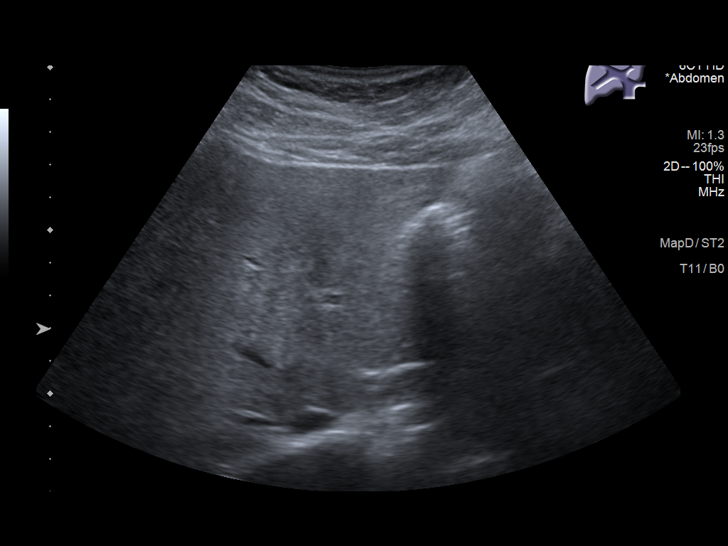
[im 36/78]
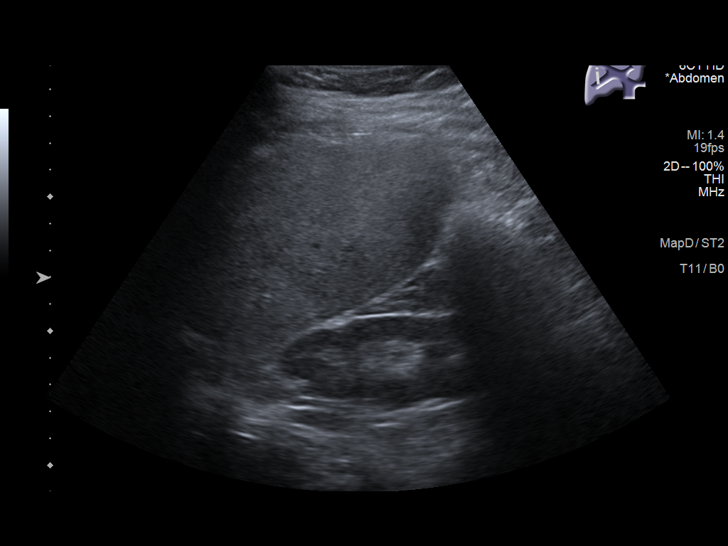
[im 42/78]
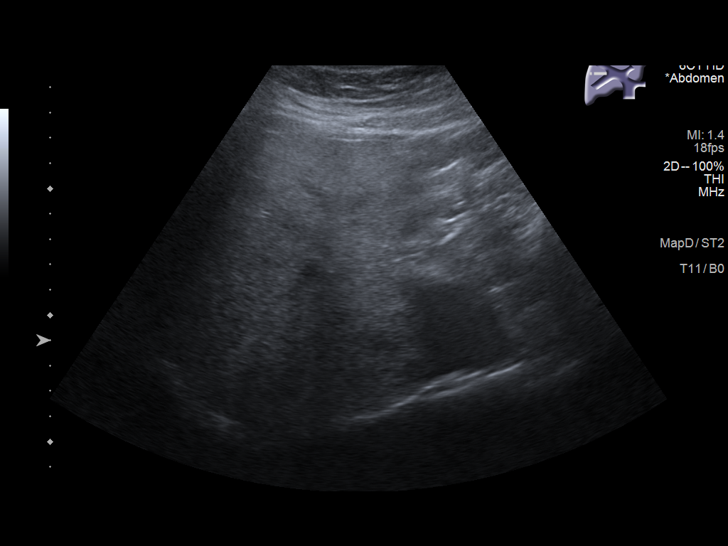
[im 49/78]
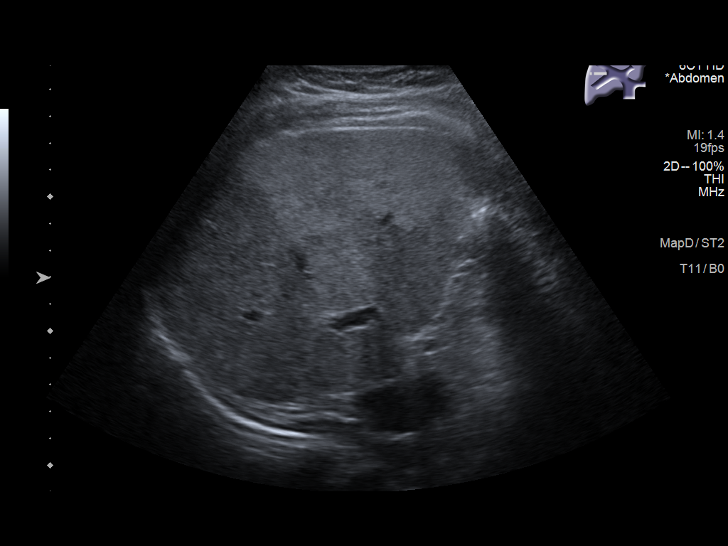
[im 52/78]
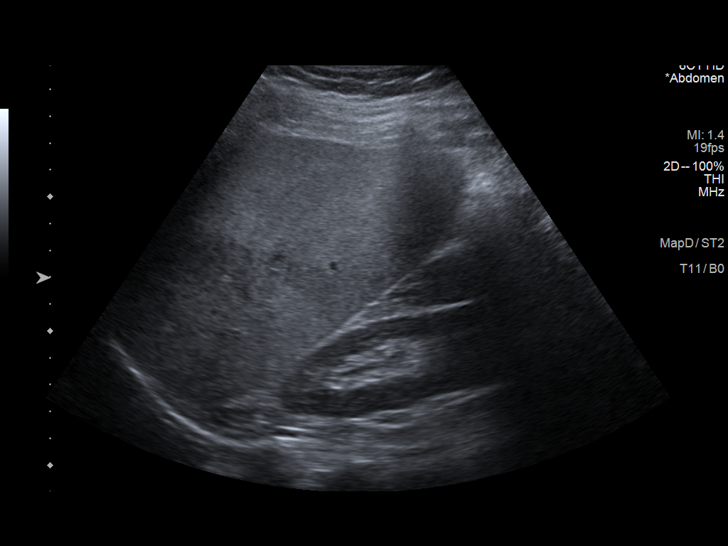
[im 58/78]
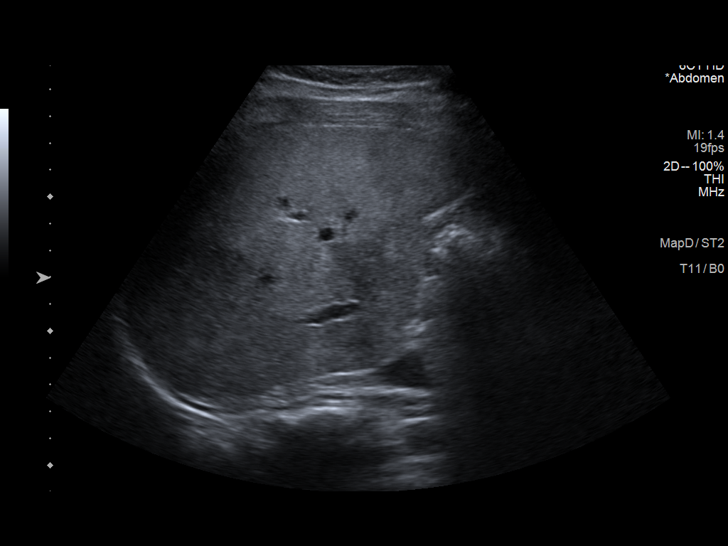
[im 65/78]
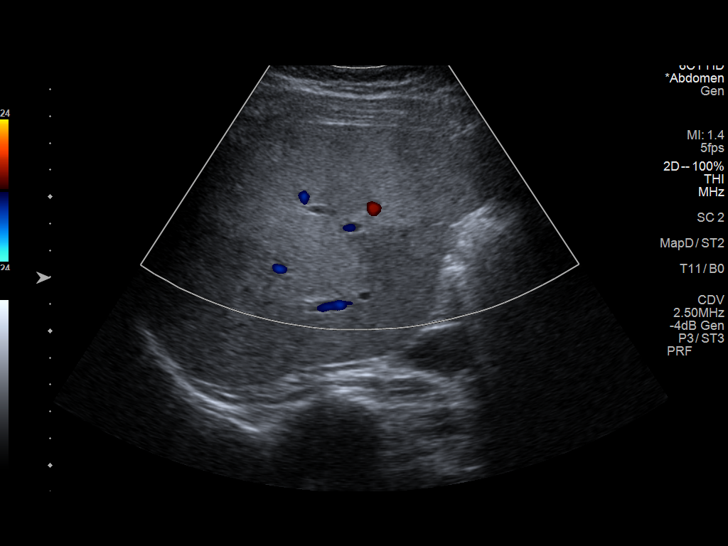
[im 71/78]
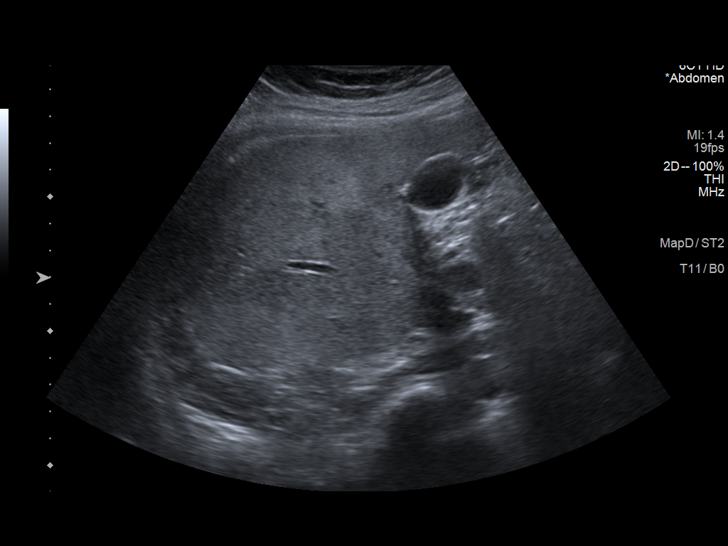
[im 78/78]
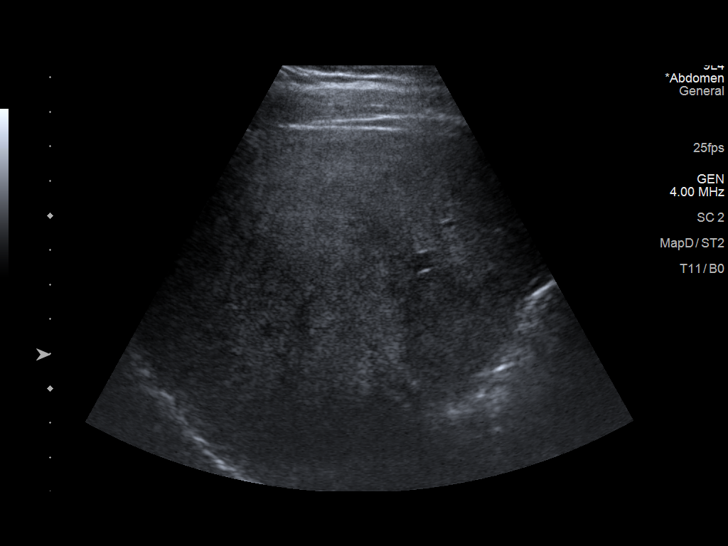

[14 of 25 positions shown; findings below may reference images not displayed]

FINDINGS: Gallbladder:

No gallstones or wall thickening visualized. There is no
pericholecystic fluid. No sonographic Murphy sign noted by
sonographer.

Common bile duct:

Diameter: 1 mm. No intrahepatic or extrahepatic biliary duct
dilatation.

Liver:

No focal lesion identified. Liver echogenicity is somewhat
inhomogeneous without disruption of hepatic architecture. Portal
vein is patent on color Doppler imaging with normal direction of
blood flow towards the liver.

Other: None.
IMPRESSION: Inhomogeneous liver echogenicity, likely due to fatty infiltration
within the liver. No focal liver lesions are evident. It should be
noted that if there is concern for focal liver lesions beyond fatty
infiltration, CT or MR of the liver pre and serial post-contrast
could be helpful to further evaluate.

Study otherwise unremarkable.

## 2020-11-13 DIAGNOSIS — Z20822 Contact with and (suspected) exposure to covid-19: Secondary | ICD-10-CM | POA: Diagnosis not present

## 2020-12-02 ENCOUNTER — Ambulatory Visit
Admission: RE | Admit: 2020-12-02 | Discharge: 2020-12-02 | Disposition: A | Discharge status: Home / Self Care | Payer: BC Managed Care – PPO | Source: Ambulatory Visit | Attending: Emergency Medicine | Admitting: Emergency Medicine

## 2020-12-02 ENCOUNTER — Other Ambulatory Visit: Payer: Self-pay

## 2020-12-02 VITALS — BP 130/79 | HR 124 | Temp 99.5°F | Resp 18

## 2020-12-02 DIAGNOSIS — Z20822 Contact with and (suspected) exposure to covid-19: Secondary | ICD-10-CM | POA: Diagnosis not present

## 2020-12-02 DIAGNOSIS — Z1152 Encounter for screening for COVID-19: Secondary | ICD-10-CM

## 2020-12-02 DIAGNOSIS — B349 Viral infection, unspecified: Secondary | ICD-10-CM

## 2020-12-02 NOTE — ED Triage Notes (Signed)
Pt here with headache, bodyaches, fever, and congestion x 3 days.

## 2020-12-02 NOTE — Discharge Instructions (Addendum)
We will call you with any positive results from your COVID-19/influenza testing completed in clinic today.  If you do not receive a phone call from [place] within the next 2-3 days, check your MyChart for up-to-date health information related to testing completed in clinic today.  For most people this is a self-limiting process and can take anywhere from 7 - 10 days to start feeling better. A cough can last up to 3 weeks. Pay special attention to handwashing as this can help prevent the spread of the virus.   Always read the labels of cough and cold medications as they may contain some of the ingredients below.  Rest, push lots of fluids (especially water), and utilize supportive care for symptoms. You may take acetaminophen (Tylenol) every 4-6 hours and ibuprofen every 6-8 hours for muscle pain, joint pain, headaches (you may also alternate these medications). Mucinex (guaifenesin) without DM may be taken over the counter for cough as needed can loosen phlegm. Please read the instructions and take as directed.  Sudafed (pseudophedrine) is sold behind the counter and can help reduce nasal pressure; avoid taking this if you have high blood pressure or feel jittery. Sudafed PE (phenylephrine) can be a helpful, short-term, over-the-counter alternative to limit side effects or if you have high blood pressure.  Flonase nasal spray can help alleviate congestion and sinus pressure. Many patients choose Afrin as a nasal decongestant; do not use for more than 3 days for risk of rebound (increased symptoms after stopping medication).  Saline nasal sprays or rinses can also help nasal congestion (use bottled or sterile water). Warm tea with lemon and honey can sooth sore throat and cough, as can cough drops.   Return to clinic for high fever not improving with medications, chest pain, difficulty breathing, non-stop vomiting, or coughing blood. Follow-up with your primary care provider if symptoms do not improve as  expected in the next 5-7 days.

## 2020-12-02 NOTE — ED Provider Notes (Signed)
CHIEF COMPLAINT:   Chief Complaint  Patient presents with   Headache   Generalized Body Aches   Fever     SUBJECTIVE/HPI:   Headache Associated symptoms: fever   Fever Associated symptoms: headaches   A very pleasant 25 y.o.Female presents today with headache, body aches, fever and congestion for the last 3 days. Patient does not report any shortness of breath, chest pain, palpitations, visual changes, weakness, tingling, headache, nausea, vomiting, diarrhea, fever, chills.   has no past medical history on file.  ROS:  Review of Systems  Constitutional:  Positive for fever.  Neurological:  Positive for headaches.  See Subjective/HPI Medications, Allergies and Problem List personally reviewed in Epic today OBJECTIVE:   Vitals:   12/02/20 1418  BP: 130/79  Pulse: (!) 124  Resp: 18  Temp: 99.5 F (37.5 C)  SpO2: 95%    Physical Exam   General: Appears well-developed and well-nourished. No acute distress.  HEENT Head: Normocephalic and atraumatic.   Ears: Hearing grossly intact, no drainage or visible deformity.  Nose: No nasal deviation.   Mouth/Throat: No stridor or tracheal deviation.  Non erythematous posterior pharynx noted with clear drainage present.  No white patchy exudate noted. Eyes: Conjunctivae and EOM are normal. No eye drainage or scleral icterus bilaterally.  Neck: Normal range of motion, neck is supple.  Cardiovascular: Normal rate. Regular rhythm; no murmurs, gallops, or rubs.  Pulm/Chest: No respiratory distress. Breath sounds normal bilaterally without wheezes, rhonchi, or rales.  Neurological: Alert and oriented to person, place, and time.  Skin: Skin is warm and dry.  No rashes, lesions, abrasions or bruising noted to skin.   Psychiatric: Normal mood, affect, behavior, and thought content.   Vital signs and nursing note reviewed.   Patient stable and cooperative with examination. PROCEDURES:    LABS/X-RAYS/EKG/MEDS:   No results found for  any visits on 12/02/20.  MEDICAL DECISION MAKING:   Patient presents with headache, body aches, fever and congestion for the last 3 days. Patient does not report any shortness of breath, chest pain, palpitations, visual changes, weakness, tingling, headache, nausea, vomiting, diarrhea, fever, chills.  COVID-19 and influenza pending.  Given symptoms along with assessment findings, likely viral illness.  Did advise the patient that I would follow-up with her over the weekend to see how she is feeling as the patient did express that she began to have some of the symptoms about 2 weeks ago and while they did get better they did not seem to go away completely.  They did worsen about 3 days ago.  Advised that if her COVID-19 and influenza tests are negative we would discuss treatment options for potential underlying infection.  Advised about home treatment and care as outlined in her AVS to include fluids, rest, Tylenol versus ibuprofen to control fever.  Patient does report a fever at home of 100 F.  No fever in clinic today.  Advised to return to clinic for new high fever not improving with medications, chest pain, difficulty breathing, nonstop vomiting or coughing up blood.  Follow-up with your PCP in the next 5 to 7 days if symptoms do not improve.  Patient verbalized understanding and agreed with treatment plan.  Patient stable upon discharge. ASSESSMENT/PLAN:  1. Encounter for screening for COVID-19 - Covid-19, Flu A+B ([facility]); Standing - Covid-19, Flu A+B ([facility])  2. Viral illness Instructions about new medications and side effects provided.  Plan:   Discharge Instructions      We will call you  with any positive results from your COVID-19/influenza testing completed in clinic today.  If you do not receive a phone call from [place] within the next 2-3 days, check your MyChart for up-to-date health information related to testing completed in clinic today.  For most people this is a  self-limiting process and can take anywhere from 7 - 10 days to start feeling better. A cough can last up to 3 weeks. Pay special attention to handwashing as this can help prevent the spread of the virus.   Always read the labels of cough and cold medications as they may contain some of the ingredients below.  Rest, push lots of fluids (especially water), and utilize supportive care for symptoms. You may take acetaminophen (Tylenol) every 4-6 hours and ibuprofen every 6-8 hours for muscle pain, joint pain, headaches (you may also alternate these medications). Mucinex (guaifenesin) without DM may be taken over the counter for cough as needed can loosen phlegm. Please read the instructions and take as directed.  Sudafed (pseudophedrine) is sold behind the counter and can help reduce nasal pressure; avoid taking this if you have high blood pressure or feel jittery. Sudafed PE (phenylephrine) can be a helpful, short-term, over-the-counter alternative to limit side effects or if you have high blood pressure.  Flonase nasal spray can help alleviate congestion and sinus pressure. Many patients choose Afrin as a nasal decongestant; do not use for more than 3 days for risk of rebound (increased symptoms after stopping medication).  Saline nasal sprays or rinses can also help nasal congestion (use bottled or sterile water). Warm tea with lemon and honey can sooth sore throat and cough, as can cough drops.   Return to clinic for high fever not improving with medications, chest pain, difficulty breathing, non-stop vomiting, or coughing blood. Follow-up with your primary care provider if symptoms do not improve as expected in the next 5-7 days.          Amalia Greenhouse, [place] 12/02/20 1437

## 2020-12-04 LAB — COVID-19, FLU A+B NAA
Influenza A, NAA: NOT DETECTED
Influenza B, NAA: NOT DETECTED
SARS-CoV-2, NAA: DETECTED — AB

## 2020-12-17 ENCOUNTER — Other Ambulatory Visit: Payer: Self-pay | Admitting: Family Medicine

## 2020-12-17 DIAGNOSIS — F419 Anxiety disorder, unspecified: Secondary | ICD-10-CM

## 2020-12-17 NOTE — Telephone Encounter (Signed)
Pt no longer with this practice. Refused refill.

## 2020-12-22 ENCOUNTER — Other Ambulatory Visit: Payer: Self-pay | Admitting: Family Medicine

## 2020-12-22 DIAGNOSIS — Z0000 Encounter for general adult medical examination without abnormal findings: Secondary | ICD-10-CM | POA: Diagnosis not present

## 2020-12-22 DIAGNOSIS — F419 Anxiety disorder, unspecified: Secondary | ICD-10-CM

## 2020-12-22 DIAGNOSIS — F411 Generalized anxiety disorder: Secondary | ICD-10-CM | POA: Diagnosis not present

## 2020-12-22 DIAGNOSIS — Z1331 Encounter for screening for depression: Secondary | ICD-10-CM | POA: Diagnosis not present

## 2020-12-22 DIAGNOSIS — Z1389 Encounter for screening for other disorder: Secondary | ICD-10-CM | POA: Diagnosis not present

## 2020-12-23 NOTE — Telephone Encounter (Signed)
[facility] [phone] called and spoke to [place] about the refill request for Paxil 10mg . Notified her that pt is longer a pt with this practice. She states that pt has new script from another practice and will take this request out. Will refuse this request.   Requested Prescriptions  Pending Prescriptions Disp Refills   PARoxetine (PAXIL) 10 MG tablet [Pharmacy Med Name: PAROXETINE HCL 10 MG TABLET] 90 tablet 0    Sig: TAKE 1 TABLET BY MOUTH EVERY DAY     Psychiatry:  Antidepressants - SSRI Failed - 12/22/2020  4:39 PM      Failed - Valid encounter within last 6 months    Recent Outpatient Visits           1 year ago Annual physical exam   [facility] [place], [place], [place]   2 years ago Annual physical exam   [facility] [place], [place], [place]   3 years ago Anxiety   [facility] [place], [place], [place]   4 years ago GAD (generalized anxiety disorder)   [facility] [place], [place], [place]   5 years ago Acute anxiety   [facility], [place]

## 2020-12-26 DIAGNOSIS — Z0000 Encounter for general adult medical examination without abnormal findings: Secondary | ICD-10-CM | POA: Diagnosis not present

## 2020-12-26 DIAGNOSIS — Z13220 Encounter for screening for lipoid disorders: Secondary | ICD-10-CM | POA: Diagnosis not present

## 2020-12-26 DIAGNOSIS — Z130 Encounter for screening for diseases of the blood and blood-forming organs and certain disorders involving the immune mechanism: Secondary | ICD-10-CM | POA: Diagnosis not present

## 2020-12-26 DIAGNOSIS — Z114 Encounter for screening for human immunodeficiency virus [HIV]: Secondary | ICD-10-CM | POA: Diagnosis not present
